# Patient Record
Sex: Female | Born: 1975 | Race: White | Hispanic: No | Marital: Married | Smoking: Former smoker
Health system: Southern US, Community
[De-identification: ages and names within clinical notes are randomized; demographics above are authoritative.]

## PROBLEM LIST (undated history)

## (undated) DIAGNOSIS — Z8632 Personal history of gestational diabetes: Secondary | ICD-10-CM

## (undated) DIAGNOSIS — F32A Depression, unspecified: Secondary | ICD-10-CM

## (undated) DIAGNOSIS — F329 Major depressive disorder, single episode, unspecified: Secondary | ICD-10-CM

## (undated) DIAGNOSIS — E282 Polycystic ovarian syndrome: Secondary | ICD-10-CM

## (undated) DIAGNOSIS — N926 Irregular menstruation, unspecified: Secondary | ICD-10-CM

## (undated) DIAGNOSIS — O24419 Gestational diabetes mellitus in pregnancy, unspecified control: Secondary | ICD-10-CM

## (undated) DIAGNOSIS — I10 Essential (primary) hypertension: Secondary | ICD-10-CM

## (undated) DIAGNOSIS — F419 Anxiety disorder, unspecified: Secondary | ICD-10-CM

## (undated) DIAGNOSIS — N979 Female infertility, unspecified: Secondary | ICD-10-CM

## (undated) HISTORY — DX: Major depressive disorder, single episode, unspecified: F32.9

## (undated) HISTORY — DX: Depression, unspecified: F32.A

## (undated) HISTORY — DX: Essential (primary) hypertension: I10

## (undated) HISTORY — DX: Personal history of gestational diabetes: Z86.32

## (undated) HISTORY — DX: Female infertility, unspecified: N97.9

## (undated) HISTORY — DX: Anxiety disorder, unspecified: F41.9

## (undated) HISTORY — DX: Gestational diabetes mellitus in pregnancy, unspecified control: O24.419

## (undated) HISTORY — DX: Polycystic ovarian syndrome: E28.2

## (undated) HISTORY — PX: TONSILLECTOMY: SUR1361

## (undated) HISTORY — DX: Irregular menstruation, unspecified: N92.6

## (undated) HISTORY — PX: ANTERIOR CRUCIATE LIGAMENT REPAIR: SHX115

---

## 1998-05-03 ENCOUNTER — Emergency Department (HOSPITAL_COMMUNITY): Admission: EM | Admit: 1998-05-03 | Discharge: 1998-05-03 | Payer: Self-pay | Admitting: Emergency Medicine

## 1999-06-28 HISTORY — PX: REDUCTION MAMMAPLASTY: SUR839

## 1999-07-16 ENCOUNTER — Encounter: Payer: Self-pay | Admitting: Specialist

## 1999-07-16 ENCOUNTER — Encounter: Admission: RE | Admit: 1999-07-16 | Discharge: 1999-07-16 | Payer: Self-pay | Admitting: Specialist

## 1999-07-19 ENCOUNTER — Ambulatory Visit (HOSPITAL_BASED_OUTPATIENT_CLINIC_OR_DEPARTMENT_OTHER): Admission: RE | Admit: 1999-07-19 | Discharge: 1999-07-20 | Payer: Self-pay | Admitting: Specialist

## 1999-07-19 ENCOUNTER — Encounter (INDEPENDENT_AMBULATORY_CARE_PROVIDER_SITE_OTHER): Payer: Self-pay | Admitting: Specialist

## 2000-07-05 ENCOUNTER — Other Ambulatory Visit: Admission: RE | Admit: 2000-07-05 | Discharge: 2000-07-05 | Payer: Self-pay | Admitting: Family Medicine

## 2003-08-14 ENCOUNTER — Other Ambulatory Visit: Admission: RE | Admit: 2003-08-14 | Discharge: 2003-08-14 | Payer: Self-pay | Admitting: Family Medicine

## 2004-10-04 ENCOUNTER — Other Ambulatory Visit: Admission: RE | Admit: 2004-10-04 | Discharge: 2004-10-04 | Payer: Self-pay | Admitting: Gynecology

## 2004-12-03 ENCOUNTER — Ambulatory Visit (HOSPITAL_COMMUNITY): Admission: RE | Admit: 2004-12-03 | Discharge: 2004-12-03 | Payer: Self-pay | Admitting: Gynecology

## 2005-06-07 ENCOUNTER — Other Ambulatory Visit: Admission: RE | Admit: 2005-06-07 | Discharge: 2005-06-07 | Payer: Self-pay | Admitting: Gynecology

## 2005-09-26 ENCOUNTER — Encounter: Admission: RE | Admit: 2005-09-26 | Discharge: 2005-09-26 | Payer: Self-pay | Admitting: Gynecology

## 2006-01-06 ENCOUNTER — Inpatient Hospital Stay (HOSPITAL_COMMUNITY): Admission: AD | Admit: 2006-01-06 | Discharge: 2006-01-09 | Payer: Self-pay | Admitting: Gynecology

## 2006-01-06 ENCOUNTER — Encounter (INDEPENDENT_AMBULATORY_CARE_PROVIDER_SITE_OTHER): Payer: Self-pay | Admitting: Specialist

## 2006-01-10 ENCOUNTER — Encounter: Admission: RE | Admit: 2006-01-10 | Discharge: 2006-01-13 | Payer: Self-pay | Admitting: Gynecology

## 2006-02-17 ENCOUNTER — Other Ambulatory Visit: Admission: RE | Admit: 2006-02-17 | Discharge: 2006-02-17 | Payer: Self-pay | Admitting: Gynecology

## 2006-12-07 ENCOUNTER — Ambulatory Visit (HOSPITAL_COMMUNITY): Admission: RE | Admit: 2006-12-07 | Discharge: 2006-12-08 | Payer: Self-pay | Admitting: Otolaryngology

## 2006-12-07 ENCOUNTER — Encounter (INDEPENDENT_AMBULATORY_CARE_PROVIDER_SITE_OTHER): Payer: Self-pay | Admitting: Otolaryngology

## 2008-03-06 ENCOUNTER — Ambulatory Visit: Payer: Self-pay | Admitting: Gynecology

## 2008-03-12 ENCOUNTER — Ambulatory Visit: Payer: Self-pay | Admitting: Gynecology

## 2008-03-24 ENCOUNTER — Ambulatory Visit: Payer: Self-pay | Admitting: Gynecology

## 2008-03-27 ENCOUNTER — Encounter: Payer: Self-pay | Admitting: Gynecology

## 2008-03-27 ENCOUNTER — Ambulatory Visit: Payer: Self-pay | Admitting: Gynecology

## 2008-03-27 ENCOUNTER — Ambulatory Visit (HOSPITAL_BASED_OUTPATIENT_CLINIC_OR_DEPARTMENT_OTHER): Admission: RE | Admit: 2008-03-27 | Discharge: 2008-03-27 | Payer: Self-pay | Admitting: Gynecology

## 2008-03-27 HISTORY — PX: HYSTEROSCOPY: SHX211

## 2008-04-27 IMAGING — CR DG CHEST 1V PORT
1 series · 1 of 1 positions shown · non-contrast
Comparison: None

CLINICAL DATA: Postop tonsillectomy. Hypoxemia.

CHEST - 1 VIEW

[view not recorded]
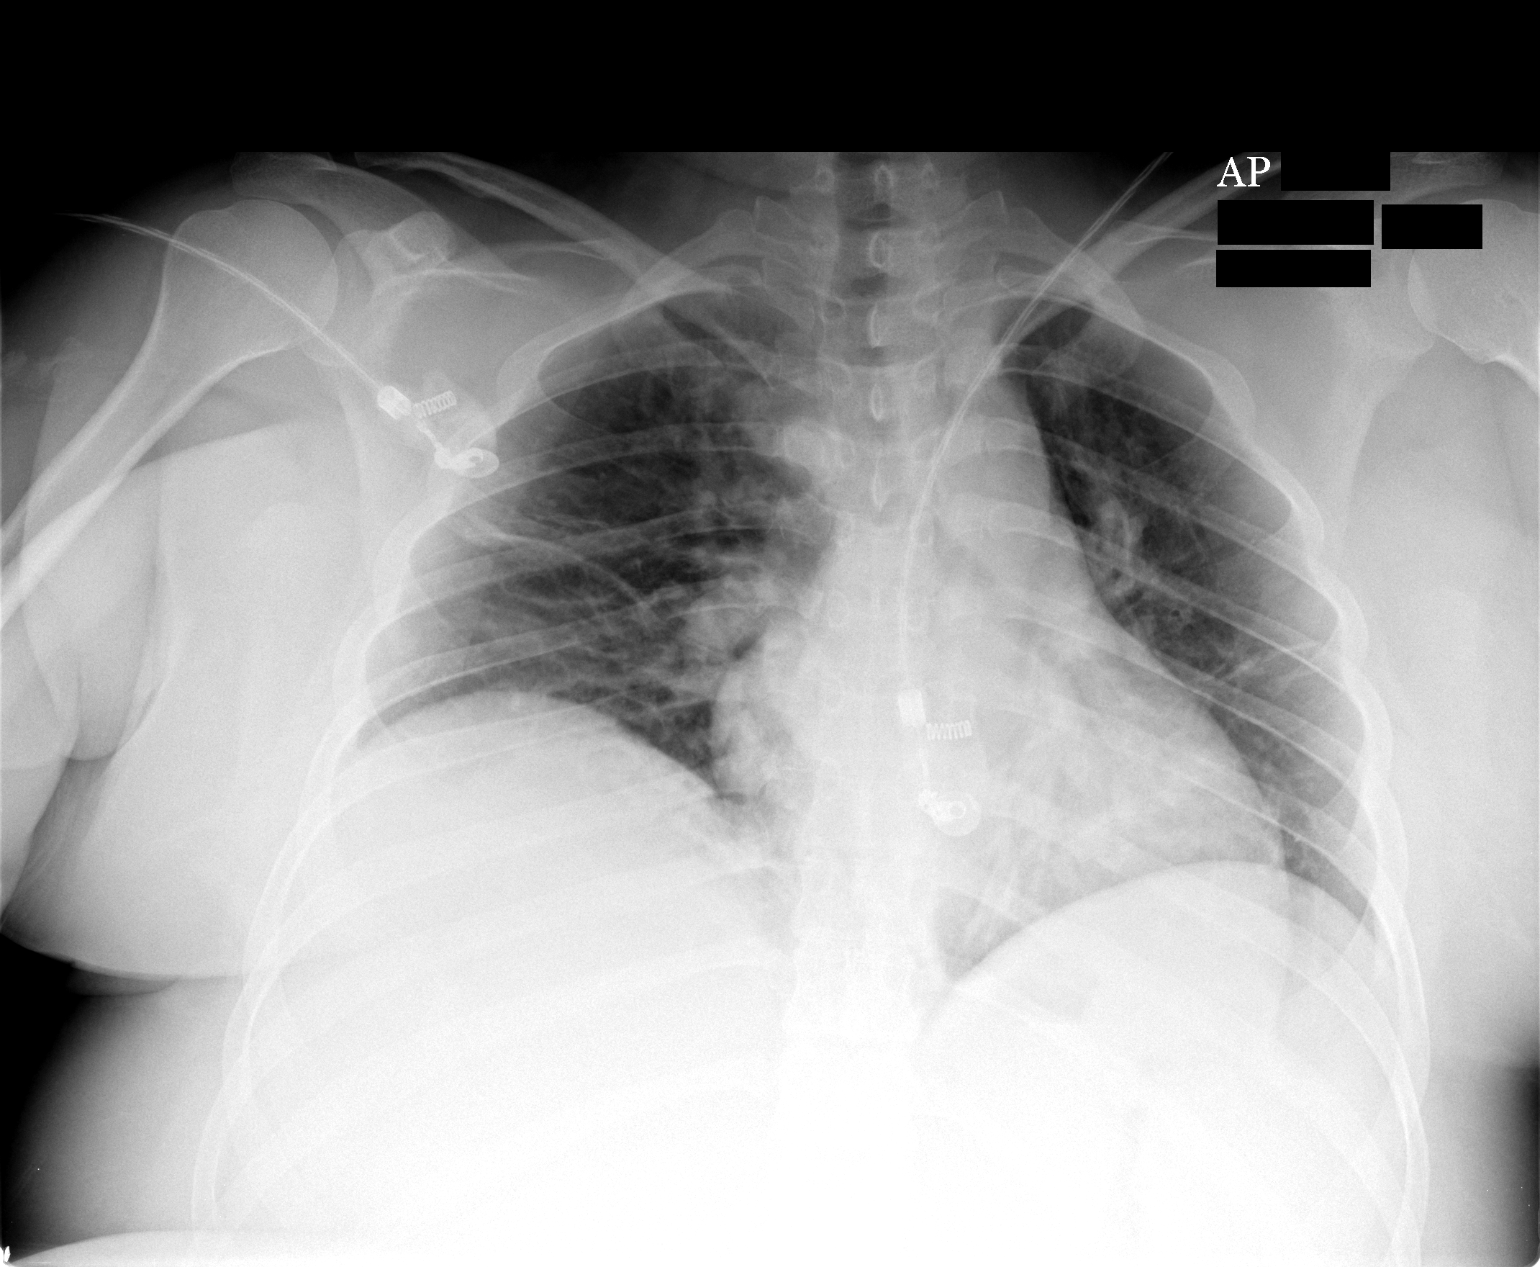

[1 of 1 positions shown; findings below may reference images not displayed]

FINDINGS: Midline trachea. Normal heart size for the level of inspiratory
effort. Normal mediastinal contours. Sharp costophrenic angles. No pneumothorax.

Markedly diminished lung volumes. Resultant prominence of pulmonary
interstitium. Linear bilateral perihilar opacities are likely related to
subsegmental atelectasis.

IMPRESSION

1. Low lung volumes with a bilateral perihilar subsegmental atelectasis.

## 2008-07-04 ENCOUNTER — Other Ambulatory Visit: Admission: RE | Admit: 2008-07-04 | Discharge: 2008-07-04 | Payer: Self-pay | Admitting: Gynecology

## 2008-07-04 ENCOUNTER — Ambulatory Visit: Payer: Self-pay | Admitting: Gynecology

## 2008-07-04 ENCOUNTER — Encounter: Payer: Self-pay | Admitting: Gynecology

## 2008-12-22 ENCOUNTER — Ambulatory Visit: Payer: Self-pay | Admitting: Gynecology

## 2009-01-16 ENCOUNTER — Ambulatory Visit: Payer: Self-pay | Admitting: Women's Health

## 2009-01-26 ENCOUNTER — Ambulatory Visit (HOSPITAL_BASED_OUTPATIENT_CLINIC_OR_DEPARTMENT_OTHER): Admission: RE | Admit: 2009-01-26 | Discharge: 2009-01-27 | Payer: Self-pay | Admitting: Orthopedic Surgery

## 2009-07-21 ENCOUNTER — Ambulatory Visit: Payer: Self-pay | Admitting: Gynecology

## 2009-08-06 ENCOUNTER — Ambulatory Visit: Payer: Self-pay | Admitting: Gynecology

## 2009-08-18 ENCOUNTER — Ambulatory Visit: Payer: Self-pay | Admitting: Gynecology

## 2009-08-24 ENCOUNTER — Ambulatory Visit: Payer: Self-pay | Admitting: Gynecology

## 2009-09-11 ENCOUNTER — Ambulatory Visit: Payer: Self-pay | Admitting: Gynecology

## 2009-09-15 ENCOUNTER — Ambulatory Visit: Payer: Self-pay | Admitting: Gynecology

## 2009-09-23 ENCOUNTER — Ambulatory Visit: Payer: Self-pay | Admitting: Gynecology

## 2009-10-02 ENCOUNTER — Ambulatory Visit: Payer: Self-pay | Admitting: Gynecology

## 2009-10-12 ENCOUNTER — Ambulatory Visit: Payer: Self-pay | Admitting: Gynecology

## 2009-10-19 ENCOUNTER — Ambulatory Visit: Payer: Self-pay | Admitting: Gynecology

## 2009-10-25 HISTORY — PX: DILATION AND CURETTAGE OF UTERUS: SHX78

## 2009-10-28 ENCOUNTER — Ambulatory Visit: Payer: Self-pay | Admitting: Gynecology

## 2009-10-29 ENCOUNTER — Ambulatory Visit (HOSPITAL_BASED_OUTPATIENT_CLINIC_OR_DEPARTMENT_OTHER): Admission: RE | Admit: 2009-10-29 | Discharge: 2009-10-29 | Payer: Self-pay | Admitting: Gynecology

## 2009-10-29 ENCOUNTER — Ambulatory Visit: Payer: Self-pay | Admitting: Gynecology

## 2009-11-12 ENCOUNTER — Ambulatory Visit: Payer: Self-pay | Admitting: Gynecology

## 2009-12-03 ENCOUNTER — Ambulatory Visit: Payer: Self-pay | Admitting: Gynecology

## 2010-03-03 ENCOUNTER — Ambulatory Visit: Payer: Self-pay | Admitting: Gynecology

## 2010-03-14 ENCOUNTER — Ambulatory Visit: Payer: Self-pay | Admitting: Gynecology

## 2010-03-23 ENCOUNTER — Ambulatory Visit: Payer: Self-pay | Admitting: Gynecology

## 2010-05-17 ENCOUNTER — Ambulatory Visit: Payer: Self-pay | Admitting: Gynecology

## 2010-05-26 ENCOUNTER — Ambulatory Visit: Payer: Self-pay | Admitting: Gynecology

## 2010-06-27 DIAGNOSIS — E282 Polycystic ovarian syndrome: Secondary | ICD-10-CM

## 2010-06-27 HISTORY — DX: Polycystic ovarian syndrome: E28.2

## 2010-08-31 ENCOUNTER — Other Ambulatory Visit (HOSPITAL_COMMUNITY): Payer: Self-pay | Admitting: Obstetrics and Gynecology

## 2010-08-31 DIAGNOSIS — N979 Female infertility, unspecified: Secondary | ICD-10-CM

## 2010-09-06 ENCOUNTER — Ambulatory Visit (HOSPITAL_COMMUNITY)
Admission: RE | Admit: 2010-09-06 | Discharge: 2010-09-06 | Disposition: A | Discharge status: Home / Self Care | Payer: Managed Care, Other (non HMO) | Source: Ambulatory Visit | Attending: Obstetrics and Gynecology | Admitting: Obstetrics and Gynecology

## 2010-09-06 DIAGNOSIS — N979 Female infertility, unspecified: Secondary | ICD-10-CM | POA: Insufficient documentation

## 2010-09-14 LAB — POCT HEMOGLOBIN-HEMACUE: Hemoglobin: 14.5 g/dL (ref 12.0–15.0)

## 2010-10-02 LAB — POCT HEMOGLOBIN-HEMACUE: Hemoglobin: 12.9 g/dL (ref 12.0–15.0)

## 2010-10-18 ENCOUNTER — Other Ambulatory Visit (HOSPITAL_COMMUNITY)
Admission: RE | Admit: 2010-10-18 | Discharge: 2010-10-18 | Disposition: A | Discharge status: Home / Self Care | Payer: Managed Care, Other (non HMO) | Source: Ambulatory Visit | Attending: Gynecology | Admitting: Gynecology

## 2010-10-18 ENCOUNTER — Other Ambulatory Visit: Payer: Self-pay | Admitting: Gynecology

## 2010-10-18 DIAGNOSIS — Z124 Encounter for screening for malignant neoplasm of cervix: Secondary | ICD-10-CM | POA: Insufficient documentation

## 2010-10-19 ENCOUNTER — Encounter (INDEPENDENT_AMBULATORY_CARE_PROVIDER_SITE_OTHER): Payer: Managed Care, Other (non HMO) | Admitting: Gynecology

## 2010-10-19 DIAGNOSIS — R809 Proteinuria, unspecified: Secondary | ICD-10-CM

## 2010-10-19 DIAGNOSIS — Z13220 Encounter for screening for lipoid disorders: Secondary | ICD-10-CM

## 2010-10-19 DIAGNOSIS — Z833 Family history of diabetes mellitus: Secondary | ICD-10-CM

## 2010-10-19 DIAGNOSIS — Z01419 Encounter for gynecological examination (general) (routine) without abnormal findings: Secondary | ICD-10-CM

## 2010-10-26 ENCOUNTER — Other Ambulatory Visit (INDEPENDENT_AMBULATORY_CARE_PROVIDER_SITE_OTHER): Payer: Managed Care, Other (non HMO)

## 2010-10-26 DIAGNOSIS — R7309 Other abnormal glucose: Secondary | ICD-10-CM

## 2010-11-03 ENCOUNTER — Other Ambulatory Visit: Payer: Managed Care, Other (non HMO)

## 2010-11-09 NOTE — Op Note (Signed)
NAME:  Melanie Barrett, Melanie Barrett      ACCOUNT NO.:  [id number]   MEDICAL RECORD NO.:  [id number]          PATIENT TYPE:  AMB   LOCATION:  DSC                          FACILITY:  [facility]   PHYSICIAN:  Robert A. Thurston Hole, M.D. DATE OF BIRTH:  [birth year]   DATE OF PROCEDURE:  01/26/2009  DATE OF DISCHARGE:                               OPERATIVE REPORT   PREOPERATIVE DIAGNOSES:  1. Right knee anterior cruciate ligament tear.  2. Right knee medial and lateral meniscal tears.  3. Right knee patellofemoral chondromalacia.   POSTOPERATIVE DIAGNOSES:  1. Right knee anterior cruciate ligament tear.  2. Right knee medial and lateral meniscal tears.  3. Right knee patellofemoral chondromalacia.   PROCEDURE:  1. Right knee examination under anesthesia followed by      arthroscopically assisted endoscopic Achilles tendon allograft,      anterior cruciate ligament reconstruction using 8 x 25-mm femoral      interference BioScrew and 9 x 28-mm tibial interference BioScrew      plus PushLock anchor.  2. Right knee partial medial and lateral meniscectomies.  3. Right knee patellofemoral chondroplasty.   SURGEON:  Elana Alm. Thurston Hole, MD   ASSISTANT:  Melanie Girt, PA   ANESTHESIA:  General.   OPERATIVE TIME:  1 hour 30 minutes.   COMPLICATIONS:  None.   INDICATIONS FOR PROCEDURE:  Melanie Barrett is a 32-year woman who  sustained a twisting pivot shift injury to her right knee approximately  2 months ago.  Exam and MRIs revealed a complete ACL tear and partial  meniscal tear with patellofemoral chondromalacia.  Melanie Barrett has had  persistent pain and instability and is now to undergo arthroscopy with  ACL reconstruction.   DESCRIPTION:  Melanie Barrett was brought to the operating room on  January 26, 2009, after a femoral nerve block was placed in the holding  room by Anesthesia.  Melanie Barrett was placed on the operative table in supine  position.  Her right knee was examined under anesthesia.   Melanie Barrett had full  range of motion, 3+ Lachman, positive pivot shift, knee stable to varus,  valgus, and posterior stress with normal patellar tracking.  The knee  was sterilely injected with 0.25% Marcaine with epinephrine.  Melanie Barrett  received Ancef 1 g IV preoperatively for prophylaxis.  The right leg was  then prepped using sterile DuraPrep and draped using sterile technique.  Originally through an anterolateral portal, the arthroscope with a pump  attached was placed and through an anteromedial portal, an arthroscopic  probe was placed.  On initial inspection, medial compartment and  articular cartilage were intact.  Medial meniscus showed partial tearing  10% of the posteromedial corner which was debrided back to a stable rim.  Intercondylar notch inspected.  The anterior cruciate ligament was  completely torn in its mid substance with significant anterior laxity  and this was thoroughly debrided and a notchplasty was performed.  Posterior cruciate was intact and stable.  Lateral compartment was  inspected.  The articular cartilage was normal.  Lateral meniscus showed  a tear of the posterolateral corner 20% which was resected back to a  stable rim.  Patellofemoral joint showed  75% grade 3 chondromalacia on  the patella only and this was debrided.  Femoral groove and articular  cartilage were intact.  Patella tracked normally.  Medial and lateral  gutters were free of pathology.  At this point, Melanie Barrett,  whose medical and surgical assistance was absolutely medically and  surgically necessary.  Prepared the ACL allograft on the back table  while I prepared the inside of the knee to accept this graft.  Melanie Barrett  prepared a 10 x 25-mm piece of Achilles bone with  two 12-mm graft.  While Melanie Barrett was doing this, I prepared the inside of the knee through a  1.5 cm anteromedial proximal tibial incision using a tibial drill guide.  The Steinmann pin was drilled up in the ACL insertion point on  tibial  plateau and then overdrilled with a 10-mm drill.  Through this tibial  tunnel, the posterior femoral guide was placed in the posterior femoral  notch.  Steinmann pin placed up into the posterior femoral notch and  drilled up into position of the ACL origin site and then overdrilled  with a 10-mm drill to a depth of 30 mm leaving a posterior 2-mm bone  ridge.  Double pin passer was then brought up through the tibial tunnel  and joined up to the femoral tunnel to the femoral cortex and thigh  through a stab wound.  This was used to pass the ACL graft up to the  tibial tunnel and jointed into the femoral tunnel.  It was locked into  position there with an 8 x 25-mm interference BioScrew.  The knee was  then brought through full range of motion.  There was found be no  impingement of the graft.  The tibial end of the graft was then locked  into position with a 9 x 28-mm interference BioScrew while Melanie  Barrett held the tibia reduced on the femur in 30 degrees of flexion.  The tibial end of the graft was further reinforced with 1 PushLock  anchor in the anteromedial tibia as well.  After this was done, knee was  tested for stability.  Lachman and pivot shift were found to be totally  eliminated and the knee could be brought through full range of motion  with no impingement of the graft.  At this point, it was felt that all  pathology had been satisfactorily addressed.  Instruments were removed.  The anteromedial incision closed with 2-0 Vicryl and 4-0 Prolene.  The  arthroscopic portals were closed with 3-0 nylon.  Sterile dressings and  a long-leg splint applied and then the patient was awakened, extubated,  and taken to recovery room in stable condition.  Needle and sponge  counts were correct x2 at the end of the case.   FOLLOWUP CARE:  Mr. Bahena will be followed overnight in  [facility] for IV pain control, neurovascular monitoring and  CPM use.   Discharge tomorrow on Percocet and Robaxin with a home CPM.  Melanie Barrett will be seen back in the office in a week for sutures out and follow  up.      Robert A. Thurston Hole, M.D.  Electronically Signed     RAW/MEDQ  D:  01/26/2009  T:  01/27/2009  Job:  [id number]

## 2010-11-09 NOTE — Op Note (Signed)
NAME:  Melanie Barrett, Melanie Barrett      ACCOUNT NO.:  [id number]   MEDICAL RECORD NO.:  [id number]          PATIENT TYPE:  AMB   LOCATION:  SDS                          FACILITY:  [facility]   PHYSICIAN:  Jefry H. Pollyann Kennedy, MD     DATE OF BIRTH:  [birth year]   DATE OF PROCEDURE:  12/07/2006  DATE OF DISCHARGE:                               OPERATIVE REPORT   PREOPERATIVE DIAGNOSES:  Obstructive sleep apnea syndrome and chronic  tonsillitis.   POSTOPERATIVE DIAGNOSES:  Obstructive sleep apnea syndrome and chronic  tonsillitis.   PROCEDURE:  Tonsillectomy.   SURGEON:  Jefry H. Pollyann Kennedy, MD.   ANESTHESIA:  General endotracheal anesthesia was used.   COMPLICATIONS:  No complications.   BLOOD LOSS:  50 mL.   FINDINGS:  Very large and deeply cryptic spaces with chronic debris  present bilaterally.  Several large-caliber vessels were feeding the  tonsils.   HISTORY:  A 35 year old with a history of chronic and recurrent  tonsillopharyngitis.  She also has a history of severe obstructive sleep  apnea syndrome, on CPAP.  Risks, benefits, alternatives and  complications of the procedure were explained to the patient and then  the family, who agreed with surgery.   DESCRIPTION OF PROCEDURE:  The patient was taken to the operating room  and placed on the operating table in supine position.  Following  induction of general endotracheal anesthesia, the table was turned and  the patient was draped in the standard fashion.  The Crowe-Davis mouth  gag was inserted into the oral cavity and used to retract the tongue and  mandible and attached to the Mayo stand.  Inspection of the palate  revealed no abnormalities.  A red rubber catheter was inserted into the  right side of the nose, was drawn into the mouth and used to retract the  soft palate and uvula through it.  Electrocautery dissection was used to  carefully dissect the tonsils from their capsules, attempting to avoid  vessels, but there were  several that were impossible to avoid.  These  were cauterized extensively with the suction cautery unit for completion  of hemostasis.  The tonsils were sent together for pathologic  evaluation.   At this time, blood and secretions were irrigated with saline solution,  and an orogastric tube was used to aspirate the contents of the stomach.  The patient was then awakened, extubated and transferred to recovery in  stable condition.      Jefry H. Pollyann Kennedy, MD  Electronically Signed     JHR/MEDQ  D:  12/07/2006  T:  12/07/2006  Job:  [id number]   cc:   Emeterio Reeve, MD

## 2010-11-09 NOTE — Op Note (Signed)
NAME:  Melanie Barrett, Melanie Barrett      ACCOUNT NO.:  [id number]   MEDICAL RECORD NO.:  [id number]          PATIENT TYPE:  AMB   LOCATION:  NESC                         FACILITY:  [facility]   PHYSICIAN:  Timothy P. Fontaine, M.D.DATE OF BIRTH:  Jan 10, 1976   DATE OF PROCEDURE:  03/27/2008  DATE OF DISCHARGE:                               OPERATIVE REPORT   PREOPERATIVE DIAGNOSES:  1. Irregular bleeding.  2. Endometrial polyps.   POSTOPERATIVE DIAGNOSES:  1. Irregular bleeding.  2. Endometrial polyps.   PROCEDURE:  Hysteroscopy, resection of endometrial polyps, dilatation  and curettage.   SURGEON:  Timothy P. Fontaine, M.D.   ANESTHESIA:  General with 1% lidocaine paracervical block.   ESTIMATED BLOOD LOSS:  Minimal.   SORBITOL DISCREPANCY:  Minimal.   SPECIMENS:  1. Endometrial polyps.  2. Endometrial curettings to pathology.   FINDINGS:  EUA, external BUS, vagina normal.  Cervix normal.  Uterus  normal size, midline and mobile.  Adnexa without masses.  Hysteroscopic  with several small posterior wall endometrial polyps.  Hysteroscopy  otherwise was normal and adequate, noting right and left tubal ostia.  Fundus, anterior, posterior uterine surfaces, lower uterine segment and  the cervical canal all visualized.   PROCEDURE:  Patient was taken to the operating room, underwent general  anesthesia.  Placed in low dorsal lithotomy position.  Received a  perineal-vaginal preparation with Betadine solution.  The bladder  emptied with in-and-out Foley catheterization.  EUA was performed, and  the patient was draped in the usual fashion.  The cervix was visualized  with a speculum.  The anterior lip grasped with a single-tooth  tenaculum, and a paracervical block using 1% lidocaine was placed, a  total of 10 cc.  The cervix was then gently and gradually dilated to  admit the operative hysteroscope, and hysteroscopy was performed with  findings noted above.  Using the right angle  resectoscopic loop, the  endometrial polyps were resected at their base at the level of the  surrounding endometrium.  A sharp curettage was then performed.  Both  specimens sent to pathology.  Re-hysteroscopy showed an empty  cavity, good distention, no evidence of perforation.  The instruments  were removed.  Hemostasis was visualized at the tenaculum site and the  cervical os.  The patient was placed in a supine position, awakened  without difficulty, and taken to the recovery room in good condition,  having tolerated the procedure well.      Timothy P. Fontaine, M.D.  Electronically Signed     TPF/MEDQ  D:  03/27/2008  T:  03/27/2008  Job:  [id number]

## 2010-11-09 NOTE — H&P (Signed)
NAME:  Melanie Barrett, Melanie Barrett      ACCOUNT NO.:  [id number]   MEDICAL RECORD NO.:  [id number]          PATIENT TYPE:  AMB   LOCATION:  NESC                         FACILITY:  [facility]   PHYSICIAN:  Timothy P. Fontaine, M.D.DATE OF BIRTH:  1976-03-07   DATE OF ADMISSION:  DATE OF DISCHARGE:                              HISTORY & PHYSICAL   DATE OF SURGERY:  October 1 at 1:00 p.m. at [facility].   CHIEF COMPLAINT:  Irregular bleeding, endometrial polyp.   HISTORY OF PRESENT ILLNESS:  Th[age]-year-old G1, P72 female presents with  a history of irregular bleeding with long skips up to 3-4 months without  period requiring progesterone withdrawals.  The patient notes over the  last several weeks she has bled on and off passing large clots and  presented for evaluation.  Outpatient evaluation included normal  hemoglobin of 14, normal prolactin and TSH, a sonohystogram, which  showed 2  clear endometrial polyps.  The patient is admitted at this  time for hysteroscopic resection and removal of endometrial polyps, D&C.   PAST MEDICAL HISTORY:  Includes:  1. Anxiety.  2. Depression.  3. Migraine headaches.   PAST SURGICAL HISTORY:  Includes breast reductive surgery.   ALLERGIES:  SULFA.   CURRENT MEDICATIONS:  1. Fluoxetine 40 mg.  2. Over-the-counter Claritin p.r.n.  3. Multivitamin.   REVIEW OF SYSTEMS:  Noncontributory.   FAMILY HISTORY:  Noncontributory.   SOCIAL HISTORY:  Noncontributory.   ADMISSION PHYSICAL EXAM:  Afebrile.  Vital signs are stable.  HEENT: Normal.  LUNGS:  Clear.  CARDIAC:  Regular rate.  No rubs, murmurs or gallops.  ABDOMINAL EXAM:  Benign.  PELVIC:  External BUS, vagina normal.  Cervix normal.  Uterus normal  size, midline and mobile, nontender.  Adnexa without masses or  tenderness.   ASSESSMENT:  Th[age]-year-old G1, P1 female with irregular menses.  Ultrasound demonstrating endometrial polyps, thyroid prolactin normal.  The patient is  admitted for hysteroscopy, D&C, polypectomy.  I reviewed  with the patient what is involved with the procedure to include  instrumentation, use of the resectoscope, D&C, use of the hysteroscope.  The risks of the procedure were reviewed to include infection, prolonged  antibiotics, uterine perforation, damage to internal organs including  bowel, bladder, ureters, vessels and nerves necessitating major  exploratory reparative surgeries, future reparative surgeries, bowel  resection, bladder repair, ureteral damage repair, ostomy formation was  all reviewed with her.  The risks of hemorrhage necessitating  transfusion and the risks of transfusion were also reviewed with her.  The risk of distended media absorption leading to metabolic  complications, seizures was also discussed, understood and accepted.  The patient understands no guarantees as far as bleeding relief were  given.  She does have bouts of amenorrhea which I think is more an  oligoovulatory pattern unrelated to the polyps, and I think that the  polyps are responsible for the most acute recent heavy bleeding.  She  understands we may need to deal with the irregular bleeding as time  moves forward following the D&C, polypectomy.  The patient's questions  answered to her satisfaction.  She is ready to  proceed with surgery.      Timothy P. Fontaine, M.D.  Electronically Signed     TPF/MEDQ  D:  03/24/2008  T:  03/24/2008  Job:  [id number]

## 2010-11-12 NOTE — Discharge Summary (Signed)
NAME:  Melanie Barrett, Melanie Barrett      ACCOUNT NO.:  [id number]   MEDICAL RECORD NO.:  [id number]          PATIENT TYPE:  INP   LOCATION:  9107                          FACILITY:  [facility]   PHYSICIAN:  Timothy P. Fontaine, M.D.DATE OF BIRTH:  January 06, 1976   DATE OF ADMISSION:  01/06/2006  DATE OF DISCHARGE:  01/09/2006                                 DISCHARGE SUMMARY   PRINCIPAL DIAGNOSIS:  Term pregnancy.  Additional diagnoses are diabetes and  suspected macrosomia.   PRINCIPAL PROCEDURE:  Primary cesarean section.   SURGEON:  Timothy P. Fontaine, M.D.   HOSPITAL COURSE:  The patient presented on January 06, 2006 for elective  primary cesarean section, due to a history of suspected macrosomia with an  unfavorable cervix; with delivery of viable female 8 pounds 8 ounces, Apgars  of 9 and 9.  Normal uterus, tubes and ovaries.  The patient  was transferred  to the recovery room and followed by the postpartum floor.  Her postpartum  course was unremarkable.  The patient was pumping bottle feeds;  was  requiring no analgesia.  She was ambulating without difficulty and voiding  without any problems at the time of discharge.  She remained afebrile and  vitals stable throughout.   POSTPARTUM EXAMINATION:  VITAL SIGNS:  She was afebrile.  HEART:  Regular rate.  LUNGS: Clear.  ABDOMEN:  Soft, nontender with good bowel sounds.  Her incision was intact.  GU:  The uterus  was firm below the umbilicus.  EXTREMITIES:  Negative.   DISCHARGE LABS:  Hemoglobin 10.4, hematocrit 28.8, white count 89, platelets  of 184.   disp  She was discharged to home.  Instructed to use Motrin necessary.   FOLLOW UP:  In the office in 6 weeks.      Ivor Costa. Farrel Gobble, M.D.  Electronically Signed     ______________________________  Nadyne Coombes. Fontaine, M.D.    THL/MEDQ  D:  01/09/2006  T:  01/09/2006  Job:  [id number]

## 2010-11-12 NOTE — Op Note (Signed)
NAME:  Melanie Barrett, Melanie Barrett      ACCOUNT NO.:  [id number]   MEDICAL RECORD NO.:  [id number]          PATIENT TYPE:  INP   LOCATION:  9199                          FACILITY:  [facility]   PHYSICIAN:  Timothy P. Fontaine, M.D.DATE OF BIRTH:  [birth year]   DATE OF PROCEDURE:  01/06/2006  DATE OF DISCHARGE:                                 OPERATIVE REPORT   PREOPERATIVE DIAGNOSES:  1.  Term pregnancy, suspect macrosomia.  2.  Gestational diabetes.  3.  Unfavorable cervix.   POSTOPERATIVE DIAGNOSES:  1.  Term pregnancy, suspect macrosomia.  2.  Gestational diabetes.  3.  Unfavorable cervix.   PROCEDURE:  Primary low transverse cervical cesarean section.   SURGEON:  Timothy P. Fontaine, M.D.   ASSISTANT:  Scrub technician.   ANESTHETIC:  Spinal.   ESTIMATED BLOOD LOSS:  Less than 500 mL.   COMPLICATIONS:  None.   FINDINGS:  At 72, normal female, Apgar scores 9/9, weight 8 pounds 8  ounces.  Pelvic anatomy noted to be normal.   PROCEDURE:  The patient was taken to the operating room, underwent spinal  anesthesia, was placed in the left tilt supine position, received an  abdominal preparation with Betadine solution, Foley catheter placed in  sterile technique per nursing personnel.  The patient was draped in the  usual fashion and after assuring adequate anesthesia, the abdomen was  sharply entered through a Pfannenstiel incision, achieving adequate  hemostasis at all levels.  The bladder flap was sharply and bluntly  developed without difficulty.  The uterus was sharply entered in the lower  uterine segment and bluntly extended laterally.  The membranes were  ruptured, the fluid noted to be clear.  The infant's vertex was in a  floating position and the vertex was delivered with the assistance of the  vacuum extractor.  The nares and mouth were suctioned, the rest of the  infant delivered, the cord doubly clamped and cut and the infant was handed  to Pediatrics in attendance.   Samples of cord blood were obtained.  Placenta  was then spontaneously extruded and was handed for cord blood banking  collection.  The uterus was then exteriorized, the endometrial cavity  explored with a sponge to remove all placental membrane fragments.  The  patient received antibiotic prophylaxis at this time.  The uterine incision  was closed in 2 layers using 0 Vicryl suture, first a running interlocking  stitch followed by an imbricating stitch.  The uterus was returned to the  abdomen, which was copiously irrigated, adequate hemostasis visualized, the  fascia reapproximated using 0 Vicryl suture in a running stitch.  The  subcutaneous tissues were irrigated, adequate hemostasis achieved with  electrocautery, the skin reapproximated using 4-0 Vicryl in a running  subcuticular stitch.  Steri-Strips and  Benzoin were applied, sterile  dressing applied, the patient taken the recovery room in good condition,  having tolerated the procedure well.      Timothy P. Fontaine, M.D.  Electronically Signed     TPF/MEDQ  D:  01/06/2006  T:  01/06/2006  Job:  [id number]

## 2010-11-12 NOTE — Discharge Summary (Signed)
[place]. [facility]  Patient:    Melanie Barrett                        MRN: [id number] Adm. Date:  [id number] Attending:  Gustavus Messing CC:         Yaakov Guthrie. Shon Hough, M.D. (2 copies)                           Discharge Summary  INDICATIONS:  A 35 year old lady with severe macromastia with back and shoulder  pain secondary to large, pendulous breasts.  PREOPERATIVE DIAGNOSIS:  Bilateral macromastia.  POSTOPERATIVE DIAGNOSIS:  Bilateral macromastia.  OPERATION:  Bilateral breast reductions using the inferior pedicle technique, excision of accessory breast tissue.  SURGEON:  Yaakov Guthrie. Shon Hough, M.D.  ASSISTANT:  Margaretha Sheffield  ANESTHESIA:  General  Preoperatively, the patient was set up and drawn for the inferior pedicle reduction mammoplasty, remarking the nipple areolar complexes 20 cm from the suprasternal  notch.   She then underwent general anesthesia and was intubated orally, prepped down to her chest breast areas in a routine fashion using Betadine scrub and solution, walled off with sterile towels, and draped so as to make a sterile field.  Xylocaine 0.50% with epinephrine 1:200,000 concentration was injected locally, 100 cc per side.  The wound was scored with a #15 blade and the skin of the inferior pedicle was de-epithelialized with a #20 blade.  Next, the medial and lateral fatty dermal pedicles were excised down to underlying fascia. Hemostasis was maintained with the Bovie electrocoagulation bilaterally.  More tissue was removed.  Next, a keyhole area was also debulked.  After proper hemostasis, the  flaps were then transposed and stayed with 3-0 Prolene.  Subcutaneous closure was done with 3-0 Monocryl x 2 layers, and then a running subcuticular stitch of 3-0 Monocryl and 5-0 Monocryl throughout the inverted T and midline area.  The wounds were drained with #10 Blake drains which were placed in the depth of the  wound nd brought out through the lateral-most portion of the incision and secured with 3-0 Prolene.  The wounds were cleansed.  Sterile dressings were applied to all the areas.  She withstood the procedures very well and was taken to the recovery room in good condition. DD:  07/19/99 TD:  07/19/99 Job: [id number]

## 2010-11-12 NOTE — H&P (Signed)
NAME:  Melanie Barrett, Melanie Barrett      ACCOUNT NO.:  [id number]   MEDICAL RECORD NO.:  [id number]          PATIENT TYPE:  INP   LOCATION:  NA                            FACILITY:  [facility]   PHYSICIAN:  Timothy P. Fontaine, M.D.DATE OF BIRTH:  1976/01/11   DATE OF ADMISSION:  01/06/2006  DATE OF DISCHARGE:                                HISTORY & PHYSICAL   CHIEF COMPLAINT:  1.  Pregnancy at term.  2.  Gestational diabetic.  3.  Impending fetal macrosomia.  4.  Unfavorable cervix.   HISTORY OF PRESENT ILLNESS:  A 35 year old G1 P0 female at [redacted] weeks  gestation, being followed for elevated fasting sugars, diet controlled with  good values.  She was found on a recent exam to have a high full ballotable  presentation, with ultrasound showing infant in the 95th percentile for  weight.  Options for management were reviewed to include continued expectant  management, serial induction, versus primary cesarean section.  Given the  extreme unfavorable pelvic exam, suspected clinical cephalopelvic  disproportion, gestational diabetes, large infant on ultrasound, the patient  and physician both felt prudent to proceed with a primary cesarean section.  For the remainder of her history, see her [place].   PHYSICAL EXAMINATION:  HEENT: Normal.  LUNGS: Clear.  CARDIAC: Regular rate.  No rubs, murmurs, or gallops.  ABDOMEN:  Gravid, vertex fetus, positive fetal heart tones.  PELVIC: 50% effaced, closed, high, ballotable presentation.   ASSESSMENT AND PLAN:  A 35 year old G1 P0, term pregnancy, gestational  diabetes, highly unfavorable exam. 95th percentile weight, for primary  cesarean section.  Reviewed options, to include continued expectant  management, the risks of the attempted vaginal delivery to include shoulder  dystocia, as well as highly probable failed induction if induction was  attempted, and the probable relative cephalopelvic disproportion with such  an unfavorable exam at term.   After a lengthy discussion, she wants to  proceed with primary cesarean section, the risk of which were discussed, to  include risks of infection, prolonged antibiotics, wound complications,  including opening and draining of incisions, closure by secondary intention,  the risks of hemorrhage necessitating transfusion, and the risks of  transfusion, including transfusion reaction, hepatitis, HIV, [place]  disease, and other unknown entities.  The risks of inadvertent injury to  internal organs, including bowel, bladder, ureters, vessels and nerves,  necessitating major exploratory reparative surgeries and future reparative  surgeries, including ostomy formation,  were all discussed, understood, and accepted.  The risks of fetal injury  during the birthing process, including musculoskeletal, neural, and scalpel  injuries, were all reviewed with her.  The patient's questions were answered  to her satisfaction.  She is ready to proceed with surgery.      Timothy P. Fontaine, M.D.  Electronically Signed     TPF/MEDQ  D:  01/05/2006  T:  01/05/2006  Job:  [id number]

## 2011-03-18 ENCOUNTER — Telehealth: Payer: Self-pay | Admitting: *Deleted

## 2011-03-18 NOTE — Telephone Encounter (Signed)
Pt called and would like you to fill out insurance form to help for discount toward her health savings account. Please advise.

## 2011-03-18 NOTE — Telephone Encounter (Signed)
Done

## 2011-03-18 NOTE — Telephone Encounter (Signed)
Pt called wanting Dr.Tf to fill out insurance form for her regarding pt blood pressure etc. Lm for pt to fax or drop form off for tf to fill out.

## 2011-04-14 LAB — CBC
HCT: 40.8
Platelets: 249
WBC: 6.6

## 2011-04-14 LAB — HCG, SERUM, QUALITATIVE: Preg, Serum: NEGATIVE

## 2011-04-14 LAB — BASIC METABOLIC PANEL
BUN: 13
Calcium: 10
GFR calc non Af Amer: >60
Potassium: 4.2

## 2011-10-14 ENCOUNTER — Encounter: Payer: Self-pay | Admitting: Gynecology

## 2011-10-14 DIAGNOSIS — E282 Polycystic ovarian syndrome: Secondary | ICD-10-CM | POA: Insufficient documentation

## 2011-10-14 DIAGNOSIS — F329 Major depressive disorder, single episode, unspecified: Secondary | ICD-10-CM | POA: Insufficient documentation

## 2011-10-14 DIAGNOSIS — F419 Anxiety disorder, unspecified: Secondary | ICD-10-CM | POA: Insufficient documentation

## 2011-10-14 DIAGNOSIS — N926 Irregular menstruation, unspecified: Secondary | ICD-10-CM | POA: Insufficient documentation

## 2011-10-14 DIAGNOSIS — G43909 Migraine, unspecified, not intractable, without status migrainosus: Secondary | ICD-10-CM | POA: Insufficient documentation

## 2011-10-24 ENCOUNTER — Ambulatory Visit (INDEPENDENT_AMBULATORY_CARE_PROVIDER_SITE_OTHER): Payer: Managed Care, Other (non HMO) | Admitting: Gynecology

## 2011-10-24 ENCOUNTER — Encounter: Payer: Self-pay | Admitting: Gynecology

## 2011-10-24 VITALS — BP 158/94 | Ht 65.5 in | Wt 216.0 lb

## 2011-10-24 DIAGNOSIS — N926 Irregular menstruation, unspecified: Secondary | ICD-10-CM

## 2011-10-24 DIAGNOSIS — Z01419 Encounter for gynecological examination (general) (routine) without abnormal findings: Secondary | ICD-10-CM

## 2011-10-24 MED ORDER — FLUOXETINE HCL 40 MG PO CAPS: 40.0000 mg | ORAL_CAPSULE | Freq: Every day | ORAL | Status: DC

## 2011-10-24 MED ORDER — METFORMIN HCL 1000 MG PO TABS: 1000.0000 mg | ORAL_TABLET | Freq: Two times a day (BID) | ORAL | Status: DC

## 2011-10-24 NOTE — Progress Notes (Signed)
Melanie Barrett [birth year] [id number]        36 y.o.  for annual exam.  Several issues noted below.  Past medical history,surgical history, medications, allergies, family history and social history were all reviewed and documented in the EPIC chart. ROS:  Was performed and pertinent positives and negatives are included in the history.  Exam: Sherrilyn Rist chaperone present Filed Vitals:   10/24/11 1010  BP: 158/94   General appearance  Normal Skin grossly normal Head/Neck normal with no cervical or supraclavicular adenopathy thyroid normal Lungs  clear Cardiac RR, without RMG Abdominal  soft, nontender, without masses, organomegaly or hernia Breasts  examined lying and sitting without masses, retractions, discharge or axillary adenopathy.  Bilateral reduction scars noted. Pelvic  Ext/BUS/vagina  normal   Cervix  normal    Uterus  Anteverted, normal size, shape and contour, midline and mobile nontender   Adnexa  Without masses or tenderness    Anus and perineum  normal   Rectovaginal  normal sphincter tone without palpated masses or tenderness.    Assessment/Plan:  35 y.o. female for annual exam.    1. Hypertension.  Patient's blood pressure is 150/94. She notes that the last several times that she has been seen in various offices her blood pressures have been elevated. Reviewed the need to be seen and evaluated and treated. Patient will make appointment to see her primary in follow up of this and knows the importance to do so. 2. Infertility. Patient had been on metformin per Dr. [name]. She stopped it last year transiently had more irregularity in her cycles and restarted it. She was supposed to take 2 g daily but is at 1 g daily having menses every 6 weeks or so. I refilled her metformin and she's going to increase to 1.5 g daily and then ultimately to 2 g per Dr. Lyndal Rainbow recommendation. She has not seen him since April of last year I strongly recommend she follow up with him  if she wants to pursue infertility. I did discuss with her the need to have her hypertension evaluated first and the risks of pregnancy with either well controlled or uncontrolled hypertension. I also reviewed the need to actively pursue fertility if this is what they desire as she is 85 and the issues of biological clock also reviewed.  She had approximately 8 cycles of Clomid without pregnancy between 2010 - 2011 and I do not feel we should continue this and she really needs to be seen by a reproductive endocrinologist. 3. Breast health. SBE monthly reviewed. Screening mammograms between 35 and 40 were reviewed. 4. Pap smear. Last Pap smear 2012. She has no history of abnormal Pap smears before with a number of normal records in her chart. No Pap smear was done today and we'll plan every 3 your Pap smear. 5. Health maintenance. No blood work was done today as she is not fasting and she is going to see her primary in follow up of her hypertension and I asked her to have him check her blood for diabetes follow up and other blood work as they see fit.    Dara Lords MD, 10:38 AM 10/24/2011

## 2011-10-24 NOTE — Patient Instructions (Signed)
Is very important that you follow up with your primary physician for your blood pressure and glucose monitoring.  Follow up with Dr. [name] in reference to infertility.

## 2011-10-26 ENCOUNTER — Other Ambulatory Visit: Payer: Self-pay | Admitting: *Deleted

## 2011-10-26 MED ORDER — METFORMIN HCL 1000 MG PO TABS: 1000.0000 mg | ORAL_TABLET | Freq: Two times a day (BID) | ORAL | Status: DC

## 2011-10-26 MED ORDER — FLUOXETINE HCL 40 MG PO CAPS: 40.0000 mg | ORAL_CAPSULE | Freq: Every day | ORAL | Status: DC

## 2011-10-26 NOTE — Progress Notes (Signed)
Patient needed rx sent to another pharmacy

## 2012-01-23 ENCOUNTER — Encounter: Payer: Self-pay | Admitting: Gynecology

## 2012-01-23 ENCOUNTER — Other Ambulatory Visit: Payer: Self-pay | Admitting: Gynecology

## 2012-01-23 ENCOUNTER — Ambulatory Visit (INDEPENDENT_AMBULATORY_CARE_PROVIDER_SITE_OTHER): Payer: Managed Care, Other (non HMO) | Admitting: Gynecology

## 2012-01-23 DIAGNOSIS — R300 Dysuria: Secondary | ICD-10-CM

## 2012-01-23 LAB — URINALYSIS W MICROSCOPIC + REFLEX CULTURE
Bilirubin Urine: NEGATIVE
Crystals: NONE SEEN
Nitrite: POSITIVE — AB
Specific Gravity, Urine: 1.025 (ref 1.005–1.030)
Urobilinogen, UA: 0.2 mg/dL (ref 0.0–1.0)

## 2012-01-23 LAB — WET PREP FOR TRICH, YEAST, CLUE
Trich, Wet Prep: NONE SEEN
WBC, Wet Prep HPF POC: NONE SEEN
Yeast Wet Prep HPF POC: NONE SEEN

## 2012-01-23 MED ORDER — NITROFURANTOIN MONOHYD MACRO 100 MG PO CAPS: 100.0000 mg | ORAL_CAPSULE | Freq: Two times a day (BID) | ORAL | Status: AC

## 2012-01-23 NOTE — Patient Instructions (Signed)
Take antibiotic twice daily for 7 days. Follow up if symptoms persist or recur.

## 2012-01-23 NOTE — Progress Notes (Signed)
Patient presents with a several days of increasing dysuria. Notes a little bit of vaginal discharge but thinks it is ovulatory as she is midcycle.  No fever chills nausea vomiting or other constitutional symptoms.  Exam with kim assistant Spine straight no CVA tenderness Abdomen soft nontender without masses guarding rebound organomegaly Pelvic external BUS vagina normal. Cervix normal. Uterus normal size midline mobile nontender. Adnexa without masses or tenderness.  Assessment and plan: Urinalysis and symptoms consistent with UTI. We'll treat with Macrobid 100 mg twice a day x7 days follow up if symptoms persist or recur. Wet prep negative and I agree that her discharge is ovulatory.

## 2012-01-26 LAB — URINE CULTURE

## 2012-10-26 ENCOUNTER — Encounter: Payer: Self-pay | Admitting: Gynecology

## 2012-10-26 ENCOUNTER — Ambulatory Visit (INDEPENDENT_AMBULATORY_CARE_PROVIDER_SITE_OTHER): Payer: Managed Care, Other (non HMO) | Admitting: Gynecology

## 2012-10-26 VITALS — BP 140/94 | Ht 65.0 in | Wt 196.0 lb

## 2012-10-26 DIAGNOSIS — N926 Irregular menstruation, unspecified: Secondary | ICD-10-CM

## 2012-10-26 DIAGNOSIS — I10 Essential (primary) hypertension: Secondary | ICD-10-CM

## 2012-10-26 DIAGNOSIS — Z01419 Encounter for gynecological examination (general) (routine) without abnormal findings: Secondary | ICD-10-CM

## 2012-10-26 DIAGNOSIS — IMO0002 Reserved for concepts with insufficient information to code with codable children: Secondary | ICD-10-CM

## 2012-10-26 NOTE — Patient Instructions (Signed)
Follow up for ultrasound as scheduled 

## 2012-10-26 NOTE — Progress Notes (Signed)
Melanie Barrett April 23, 1976 [id number]        37 y.o.  G2P1011 for annual exam.  Several issues noted below.  Past medical history,surgical history, medications, allergies, family history and social history were all reviewed and documented in the EPIC chart. ROS:  Was performed and pertinent positives and negatives are included in the history.  Exam: [name] Filed Vitals:   10/26/12 0854  BP: 140/94  Height: 5\' 5"  (1.651 m)  Weight: 196 lb (88.905 kg)   General appearance  Normal Skin grossly normal Head/Neck normal with no cervical or supraclavicular adenopathy thyroid normal Lungs  clear Cardiac RR, without RMG Abdominal  soft, nontender, without masses, organomegaly or hernia Breasts  examined lying and sitting without masses, retractions, discharge or axillary adenopathy. Pelvic  Ext/BUS/vagina  normal   Cervix  normal   Uterus  anteverted, normal size, shape and contour, midline and mobile nontender   Adnexa  Without masses or tenderness    Anus and perineum  normal   Rectovaginal  normal sphincter tone without palpated masses or tenderness.    Assessment/Plan:  37 y.o. G86P1011 female for annual exam.   1. PCO/infertility. Patient's menses continue at 6-8 weeks. Long history of PCO. Still attempting pregnancy. Had used Clomid in the past both for successful pregnancies and then unsuccessful trial. Saw Dr. [name] and was started on metformin. She has not seen him though for over a year. Still wants to achieve pregnancy. She just joined [employer] and I told her that if she loses a fair amount of weight this certainly will significantly improve her pregnancy chances as well as I would consider using Clomid again if we altered her weight. I discussed the issues of Clomid to include hyperstimulation multiple gestation and possible ovarian cancer linkage particularly in patients who use prolonged courses. She is on a prenatal vitamin and will continue on  this. 2. Dyspareunia/deep. Particularly over the past month patient is having consistent deep dyspareunia. She also has significant ovulatory pain. I discussed possible endometriosis and recommended to start with ultrasound and she'll schedule this in followup for this. 3. Hypertension. Her blood pressure is 140/94. She is on Aldomet 250 mg supposedly twice daily but admits to only taking it once daily. I reviewed the need to be more aggressive in her hypertensive management and she actually has an appointment with her primary is to follow up with them for this. 4. Mammography. I reviewed screening mammogram recommendations between 35 and 40. She has no strong family history and she is comfortable with waiting closer to 40. SBE monthly reviewed. 5. Pap smear 2012. No Pap smear done today. No history of significant abnormal Pap smears. Plan repeat Pap smear next year a 3 year interval. 6. Health maintenance. No blood work done today as it is all done through her primary physician's office to include following her glucose and lipid profile. Patient will followup for ultrasound and we'll go from there.    Dara Lords MD, 9:27 AM 10/26/2012

## 2012-10-27 LAB — URINALYSIS W MICROSCOPIC + REFLEX CULTURE
Bacteria, UA: NONE SEEN
Hgb urine dipstick: NEGATIVE
Ketones, ur: NEGATIVE mg/dL
Leukocytes, UA: NEGATIVE
Nitrite: NEGATIVE
Protein, ur: 30 mg/dL — AB

## 2012-11-02 ENCOUNTER — Other Ambulatory Visit: Payer: Self-pay | Admitting: Gynecology

## 2012-11-09 ENCOUNTER — Encounter: Payer: Self-pay | Admitting: Gynecology

## 2012-11-09 ENCOUNTER — Ambulatory Visit (INDEPENDENT_AMBULATORY_CARE_PROVIDER_SITE_OTHER): Payer: Managed Care, Other (non HMO) | Admitting: Gynecology

## 2012-11-09 ENCOUNTER — Ambulatory Visit (INDEPENDENT_AMBULATORY_CARE_PROVIDER_SITE_OTHER): Payer: Managed Care, Other (non HMO)

## 2012-11-09 DIAGNOSIS — E282 Polycystic ovarian syndrome: Secondary | ICD-10-CM

## 2012-11-09 DIAGNOSIS — IMO0002 Reserved for concepts with insufficient information to code with codable children: Secondary | ICD-10-CM

## 2012-11-09 NOTE — Progress Notes (Signed)
Patient presents for ultrasound due 2 dyspareunia. Ultrasound shows uterus normal size and echotexture. 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 Endometrial echo 4.9 mm. Right and left ovaries normal with classic ring of pearls sign on the right ovary. Multiple small follicles on the left also. Cul-de-sac negative.  Assessment and plan: Deep dyspareunia. Reviewed possibilities to include endometriosis or adhesive disease. Next appendectomy laparoscopy. Patient to monitor her pain if it does persist worsen then she'll followup to consider laparoscopy. She is in [employer] and has lost 9 pounds. She can reproduce and when she is at 180 pounds which is the weight she was when she conceived her first child and we will consider Clomid at that point. If her pain continues I discussed possible laparoscopy before Clomid and that if we do find endometriosis and treated that this may improve her fertility chances.

## 2012-11-09 NOTE — Patient Instructions (Signed)
Consider laparoscopy if your pain continues. Followup as needed.

## 2013-02-11 ENCOUNTER — Telehealth: Payer: Self-pay | Admitting: *Deleted

## 2013-02-11 NOTE — Telephone Encounter (Signed)
Pt said she will continue taking medication, she was unaware this was helping with her fertility.

## 2013-02-11 NOTE — Telephone Encounter (Signed)
Certainly if she does not feel good on the medication she can always stop it. She would need to follow her menses and as long as they remained regular then follow. She would accept any possible negative impact as far as fertility by stopping the medication.

## 2013-02-11 NOTE — Telephone Encounter (Signed)
Pt diagnosed with PCOS. had referral to see Dr.YakinKaya he prescribed metformin 1,000 twice daily but pt has been taking only 1,000 mg daily has not seen him in over a year. Pt said she cycles continue every 4-5 weeks apart and this is normal for her. She is losing weight, pt asked if you think she could stop metformin? It doesn't make her feel good, She is not going back to see Dr,.Dyke Brackett and would like your recommendations. Please advise

## 2013-02-13 ENCOUNTER — Other Ambulatory Visit: Payer: Self-pay | Admitting: Gynecology

## 2013-05-07 ENCOUNTER — Encounter: Payer: Self-pay | Admitting: Gynecology

## 2013-05-07 ENCOUNTER — Ambulatory Visit (INDEPENDENT_AMBULATORY_CARE_PROVIDER_SITE_OTHER): Payer: 59 | Admitting: Gynecology

## 2013-05-07 DIAGNOSIS — E282 Polycystic ovarian syndrome: Secondary | ICD-10-CM

## 2013-05-07 DIAGNOSIS — N926 Irregular menstruation, unspecified: Secondary | ICD-10-CM

## 2013-05-07 MED ORDER — CLOMIPHENE CITRATE 50 MG PO TABS: 50.0000 mg | ORAL_TABLET | Freq: Every day | ORAL | Status: DC

## 2013-05-07 NOTE — Patient Instructions (Signed)
Take Clomid 50 mg daily days 5 through 9 of your next cycle. Call me at the end of the cycle with either your menses or if late to check a pregnancy test

## 2013-05-07 NOTE — Progress Notes (Signed)
Patient presents to discuss her irregular menses and desires for pregnancy. She is a history of PCOS with prior pregnancies achieved using Clomid x2 one live birth and one early SAB. Subsequently saw Dr. [name] who put her on Glucophage and she's on 1000 mg twice a day. She has lost a fair amount of weight and now her menses have become more regular for her last cycle was 4 weeks although prior to that it was 6 weeks. She desires a trial of Clomid. She was on Femara which I utilized by Dr. [name] but unfortunately did not achieve pregnancy with the next step being in vitro. Since she achieved 2 pregnancies prior with Clomid she wanted to try this first before moving on to in vitro. I reviewed with her the possible benefits of Femara over Clomid in a PCOS patient as far as stimulation and endometrial effect. I reviewed the risks of Clomid to include hyperstimulation, multiple gestation including triplets and possible ovarian cancer linkage. She is over the age of 7 and the increased risk for chromosomal anomalies also reviewed. She's been treated with low-dose Aldomet for hypertension and the increased risk of hypertensive disorders in pregnancy associated with patient's with chronic hypertension also discussed. She is on Prozac which I recommend she stop now or as soon as possible she recognizes pregnancy and she is on a multivitamin with folic acid.  Exam was Administrator, [employer] vagina normal. Cervix normal. Uterus normal size midline mobile nontender. Adnexa without masses or tenderness.  Assessment and plan: PCOS patient with weight loss on metformin with more regular menses. Desires trial of Clomid given prior success recognizing the issues when compared to Femara and the risks involved as outlined above. We'll go with 50 mg Clomid days 5 through 9 #5 no refill and she will call at the end of the first cycle to see how she's doing and we'll go from there.

## 2013-07-05 ENCOUNTER — Telehealth: Payer: Self-pay | Admitting: *Deleted

## 2013-07-05 DIAGNOSIS — N912 Amenorrhea, unspecified: Secondary | ICD-10-CM

## 2013-07-05 NOTE — Telephone Encounter (Signed)
Pt called [facility] 05/25/13 did Clomid cycle. 2 positive home preg test. Requested Quant. Wants to come Monday morning. Quant order placed. Will call with results and will make apt after results come back

## 2013-07-08 ENCOUNTER — Other Ambulatory Visit: Payer: 59

## 2013-07-08 ENCOUNTER — Telehealth: Payer: Self-pay | Admitting: Gynecology

## 2013-07-08 DIAGNOSIS — N912 Amenorrhea, unspecified: Secondary | ICD-10-CM

## 2013-07-08 LAB — HCG, QUANTITATIVE, PREGNANCY: hCG, Beta Chain, Quant, S: 4073.2 m[IU]/mL

## 2013-07-08 NOTE — Telephone Encounter (Signed)
Tell patient that her quantitative hCG is 4000 which is definitely up there. Schedule ultrasound for viability and fetal number.

## 2013-07-09 NOTE — Telephone Encounter (Signed)
Pt informed with the below note, transferred to front desk. Order placed.

## 2013-07-09 NOTE — Telephone Encounter (Signed)
Left message on pt voicemail

## 2013-07-10 ENCOUNTER — Ambulatory Visit (INDEPENDENT_AMBULATORY_CARE_PROVIDER_SITE_OTHER): Payer: 59

## 2013-07-10 ENCOUNTER — Other Ambulatory Visit: Payer: Self-pay | Admitting: Gynecology

## 2013-07-10 ENCOUNTER — Ambulatory Visit (INDEPENDENT_AMBULATORY_CARE_PROVIDER_SITE_OTHER): Payer: 59 | Admitting: Gynecology

## 2013-07-10 ENCOUNTER — Encounter: Payer: Self-pay | Admitting: Gynecology

## 2013-07-10 DIAGNOSIS — O200 Threatened abortion: Secondary | ICD-10-CM

## 2013-07-10 DIAGNOSIS — O30009 Twin pregnancy, unspecified number of placenta and unspecified number of amniotic sacs, unspecified trimester: Secondary | ICD-10-CM

## 2013-07-10 DIAGNOSIS — O30049 Twin pregnancy, dichorionic/diamniotic, unspecified trimester: Secondary | ICD-10-CM

## 2013-07-10 DIAGNOSIS — N831 Corpus luteum cyst of ovary, unspecified side: Secondary | ICD-10-CM

## 2013-07-10 DIAGNOSIS — N912 Amenorrhea, unspecified: Secondary | ICD-10-CM

## 2013-07-10 DIAGNOSIS — O3091 Multiple gestation, unspecified, first trimester: Secondary | ICD-10-CM

## 2013-07-10 DIAGNOSIS — O09529 Supervision of elderly multigravida, unspecified trimester: Secondary | ICD-10-CM

## 2013-07-10 LAB — US OB TRANSVAGINAL

## 2013-07-10 LAB — US OB COMP LESS 14 WKS

## 2013-07-10 NOTE — Patient Instructions (Signed)
Wean from Prozac as we discussed. Call to make an appointment this week for a new OB appointment. Alert them that you have twins.

## 2013-07-10 NOTE — Progress Notes (Signed)
Patient presents for ultrasound. Did Clomid cycle with missed menses and positive pregnancy test. Quantitative hCG returned 4000.  Ultrasound shows twin gestation: Twin A  5 weeks 5 days FHM 114 bpm. Subchorionic hematoma between sac A and B.. Cervix long and closed Twin B 5 weeks 1 day FHM 97 bpm no yolk sac noted.  Assessment and plan: Twin gestation following Clomid cycle. Reviewed possible loss of twin B with the parents. She's doing no bleeding having mild cramping. Copies of her ultrasound report provided. Patient will call this week make an appointment to see an obstetrician and alert them that she has twins. Risks reviewed to include preterm delivery. Need to wean from Prozac reviewed and she's going to do this over the next month. We have discussed previously the risks of chromosomal abnormalities as well as the hypertension issue per my 11/11 2014 note. Patient will followup with an obstetrician for continuing care. She is on prenatal vitamins and will continue on so.

## 2013-08-08 LAB — OB RESULTS CONSOLE RUBELLA ANTIBODY, IGM: Rubella: IMMUNE

## 2013-08-08 LAB — OB RESULTS CONSOLE RPR: RPR: NONREACTIVE

## 2013-08-08 LAB — OB RESULTS CONSOLE HEPATITIS B SURFACE ANTIGEN: Hepatitis B Surface Ag: NEGATIVE

## 2013-08-08 LAB — OB RESULTS CONSOLE ABO/RH: RH TYPE: POSITIVE

## 2013-08-08 LAB — OB RESULTS CONSOLE HIV ANTIBODY (ROUTINE TESTING): HIV: NONREACTIVE

## 2013-08-08 LAB — OB RESULTS CONSOLE ANTIBODY SCREEN: Antibody Screen: NEGATIVE

## 2013-12-18 ENCOUNTER — Encounter: Payer: 59 | Attending: Obstetrics and Gynecology

## 2013-12-18 VITALS — Ht 65.0 in | Wt 224.2 lb

## 2013-12-18 DIAGNOSIS — O99810 Abnormal glucose complicating pregnancy: Secondary | ICD-10-CM | POA: Diagnosis present

## 2013-12-18 DIAGNOSIS — Z713 Dietary counseling and surveillance: Secondary | ICD-10-CM | POA: Diagnosis not present

## 2013-12-23 NOTE — Progress Notes (Signed)
  Patient was seen on 12/18/13 for Gestational Diabetes self-management class at the [facility]. The following learning objectives were met by the patient during this course:   States the definition of Gestational Diabetes  States why dietary management is important in controlling blood glucose  Describes the effects of carbohydrates on blood glucose levels  Demonstrates ability to create a balanced meal plan  Demonstrates carbohydrate counting   States when to check blood glucose levels  Demonstrates proper blood glucose monitoring techniques  States the effect of stress and exercise on blood glucose levels  States the importance of limiting caffeine and abstaining from alcohol and smoking  Plan:  Aim for 2 Carb Choices per meal (30 grams) +/- 1 either way for breakfast Aim for 3 Carb Choices per meal (45 grams) +/- 1 either way from lunch and dinner Aim for 1-2 Carbs per snack Begin reading food labels for Total Carbohydrate and sugar grams of foods Consider  increasing your activity level by walking daily as tolerated Begin checking BG before breakfast and 1-2 hours after first bit of breakfast, lunch and dinner after  as directed by MD  Take medication  as directed by MD  Blood glucose monitor given:  One Touch Ultra 2 Self Monitoring Kit Lot # [id number] X Exp: 3/16 Blood glucose reading: $RemoveBeforeDE'97mg'AuYEivkmfdRwEVF$ /dl  Patient instructed to monitor glucose levels: FBS: 60 - <90 1 hour: <140 2 hour: <120  Patient received the following handouts:  Nutrition Diabetes and Pregnancy  Carbohydrate Counting List  Meal Planning worksheet  Patient will be seen for follow-up as needed.

## 2014-01-27 ENCOUNTER — Other Ambulatory Visit (HOSPITAL_COMMUNITY): Payer: Self-pay | Admitting: Obstetrics and Gynecology

## 2014-01-27 ENCOUNTER — Ambulatory Visit (HOSPITAL_COMMUNITY)
Admission: RE | Admit: 2014-01-27 | Discharge: 2014-01-27 | Disposition: A | Discharge status: Home / Self Care | Payer: 59 | Source: Ambulatory Visit | Attending: Obstetrics and Gynecology | Admitting: Obstetrics and Gynecology

## 2014-01-27 DIAGNOSIS — O30009 Twin pregnancy, unspecified number of placenta and unspecified number of amniotic sacs, unspecified trimester: Secondary | ICD-10-CM | POA: Diagnosis not present

## 2014-01-27 DIAGNOSIS — O288 Other abnormal findings on antenatal screening of mother: Secondary | ICD-10-CM

## 2014-01-27 DIAGNOSIS — O289 Unspecified abnormal findings on antenatal screening of mother: Secondary | ICD-10-CM | POA: Insufficient documentation

## 2014-02-18 ENCOUNTER — Inpatient Hospital Stay (HOSPITAL_COMMUNITY)
Admission: AD | Admit: 2014-02-18 | Payer: Managed Care, Other (non HMO) | Source: Ambulatory Visit | Admitting: Obstetrics and Gynecology

## 2014-02-19 ENCOUNTER — Encounter (HOSPITAL_COMMUNITY): Payer: Self-pay

## 2014-02-19 ENCOUNTER — Encounter (HOSPITAL_COMMUNITY)
Admission: RE | Admit: 2014-02-19 | Discharge: 2014-02-19 | Disposition: A | Discharge status: Home / Self Care | Payer: 59 | Source: Ambulatory Visit | Attending: Obstetrics and Gynecology | Admitting: Obstetrics and Gynecology

## 2014-02-19 LAB — COMPREHENSIVE METABOLIC PANEL WITH GFR
ALT: 6 U/L (ref 0–35)
AST: 11 U/L (ref 0–37)
Albumin: 2.7 g/dL — ABNORMAL LOW (ref 3.5–5.2)
Alkaline Phosphatase: 126 U/L — ABNORMAL HIGH (ref 39–117)
Anion gap: 11 (ref 5–15)
BUN: 7 mg/dL (ref 6–23)
CO2: 25 meq/L (ref 19–32)
Calcium: 8.7 mg/dL (ref 8.4–10.5)
Chloride: 103 meq/L (ref 96–112)
Creatinine, Ser: 0.57 mg/dL (ref 0.50–1.10)
GFR calc Af Amer: >90 mL/min
GFR calc non Af Amer: >90 mL/min
Glucose, Bld: 82 mg/dL (ref 70–99)
Potassium: 3.6 meq/L — ABNORMAL LOW (ref 3.7–5.3)
Sodium: 139 meq/L (ref 137–147)
Total Bilirubin: 0.7 mg/dL (ref 0.3–1.2)
Total Protein: 5.7 g/dL — ABNORMAL LOW (ref 6.0–8.3)

## 2014-02-19 LAB — CBC
HCT: 32.3 % — ABNORMAL LOW (ref 36.0–46.0)
Hemoglobin: 11.1 g/dL — ABNORMAL LOW (ref 12.0–15.0)
MCH: 30.7 pg (ref 26.0–34.0)
MCHC: 34.4 g/dL (ref 30.0–36.0)
MCV: 89.5 fL (ref 78.0–100.0)
Platelets: 136 10*3/uL — ABNORMAL LOW (ref 150–400)
RBC: 3.61 MIL/uL — ABNORMAL LOW (ref 3.87–5.11)
RDW: 17.2 % — ABNORMAL HIGH (ref 11.5–15.5)
WBC: 6.7 10*3/uL (ref 4.0–10.5)

## 2014-02-19 LAB — ABO/RH: ABO/RH(D): B POS

## 2014-02-19 LAB — SYPHILIS: RPR W/REFLEX TO RPR TITER AND TREPONEMAL ANTIBODIES, TRADITIONAL SCREENING AND DIAGNOSIS ALGORITHM

## 2014-02-19 NOTE — Pre-Procedure Instructions (Signed)
Platelet count of 136 reported to Dr Rodman Pickle, order given to repeat day of surgery.

## 2014-02-19 NOTE — Patient Instructions (Signed)
20 Flannery Cavallero  02/19/2014   Your procedure is scheduled on:  02/21/14  Enter through the Main Entrance of [facility] at 6 AM.  Pick up the phone at the desk and dial [phone].   Call this number if you have problems the morning of surgery: [phone]   Remember:   Do not eat food:After Midnight.  Do not drink clear liquids: After Midnight.  Take these medicines the morning of surgery with A SIP OF WATER: Blood pressure medication   Do not wear jewelry, make-up or nail polish.  Do not wear lotions, powders, or perfumes. You may wear deodorant.  Do not shave 48 hours prior to surgery.  Do not bring valuables to the hospital.  [facility] is not   responsible for any belongings or valuables brought to the hospital.  Contacts, dentures or bridgework may not be worn into surgery.  Leave suitcase in the car. After surgery it may be brought to your room.  For patients admitted to the hospital, checkout time is 11:00 AM the day of              discharge.   Patients discharged the day of surgery will not be allowed to drive             home.  Name and phone number of your driver: NA  Special Instructions:      Please read over the following fact sheets that you were given:   Surgical Site Infection Prevention

## 2014-02-20 NOTE — H&P (Signed)
Melanie Barrett is a 38 y.o. female G3P1011 at 34 0/7 weeks (EDD 03/07/14 by 5 week [place]) presenting for scheduled repeat c-section for twin gestation and breech/breech presentation.  Twin pregnancy di/di with concordant growth, last [place] 8/7 showed babies 6#5ozx 2.  Prenatal care significant for: PCOS-conceived on metformin and clomid CHTN-on aldomet  po BID, then  Increased to  po BID on 01/23/14.  No PIH sx and 24 hours urine and labs WNL GDM--BS well-controlled on diet. Prior LTCS for LGA baby   Maternal Medical History:  Reason for admission: Nausea.  Contractions: Frequency: irregular.   Perceived severity is mild.    Fetal activity: Perceived fetal activity is normal.    Prenatal complications: PIH.   Prenatal Complications - Diabetes: gestational. Diabetes is managed by diet.      OB History   Grav Para Term Preterm Abortions TAB SAB Ect Mult Living   Primiary LTCS 2007 8#8oz SAB x 1  Past Medical History  Diagnosis Date  . Irregular menses   . Anxiety   . Depression   . Migraine   . Hx gestational diabetes   . PCOS (polycystic ovarian syndrome) 06/2010  . Hypertension   . Infertility, female   . Gestational diabetes mellitus, antepartum    Past Surgical History  Procedure Laterality Date  . Reduction mammaplasty  2001  . Hysteroscopy  03/2008    D&C  . Dilation and curettage of uterus  10/2009    SUCTION D&C FOR MISSED AB  . Cesarean section    . Tonsillectomy    . Anterior cruciate ligament repair      REPLACEMENT   Family History: family history includes Cancer in her other; Diabetes in her maternal grandmother; Hyperlipidemia in her other; Hypertension in her father; Obesity in her other. Social History:  reports that she has quit smoking. She has never used smokeless tobacco. She reports that she drinks alcohol. She reports that she does not use illicit drugs.   Prenatal Transfer Tool  Maternal Diabetes: Yes:   Diabetes Type:  Diet controlled Genetic Screening: Declined Maternal Ultrasounds/Referrals: Normal Fetal Ultrasounds or other Referrals:  None Maternal Substance Abuse:  No Significant Maternal Medications:  None Significant Maternal Lab Results:  None Other Comments:  None  Review of Systems  Gastrointestinal: Negative for nausea, vomiting and abdominal pain.  Neurological: Negative for headaches.      Last menstrual period 05/25/2013. Maternal Exam:  Uterine Assessment: Contraction strength is mild.  Contraction frequency is regular.   Abdomen: Patient reports no abdominal tenderness. Surgical scars: low transverse.   Fundal height is 52 cm.   Estimated fetal weight is 7 #, 7#.    Introitus: Normal vulva. Normal vagina.    Physical Exam  Constitutional: She is oriented to person, place, and time. She appears well-developed and well-nourished.  Cardiovascular: Normal rate and regular rhythm.   Respiratory: Effort normal.  GI: Soft.  Genitourinary: Vagina normal.  Neurological: She is alert and oriented to person, place, and time.  Psychiatric: She has a normal mood and affect.    Prenatal labs: ABO, Rh: --/--/B POS, B POS (08/26 0840) Antibody: NEG (08/26 0840) Rubella: Immune (02/12 0907) RPR: NON REAC (08/26 0840)  HBsAg: Negative (02/12 0907)  HIV: Non-reactive (02/12 0907)  GBS:   Negative One hour GTT  138 Three hour GTT 95/198/184/105 CF negative Declined genetics  Assessment/Plan: Patient was counseled regarding the risks and  benefits of C-section and the procedure was reviewed in detail. Risks of bleeding, infection, and possible damage to bowel and bladder were reviewed, The patient would accept a blood transfusion if needed. She desires to proceed.    Oliver Pila 02/20/2014, 11:58 AM

## 2014-02-21 ENCOUNTER — Inpatient Hospital Stay (HOSPITAL_COMMUNITY)
Admission: RE | Admit: 2014-02-21 | Discharge: 2014-02-23 | DRG: 765 | Disposition: A | Discharge status: Home / Self Care | Payer: 59 | Source: Ambulatory Visit | Attending: Obstetrics and Gynecology | Admitting: Obstetrics and Gynecology

## 2014-02-21 ENCOUNTER — Encounter (HOSPITAL_COMMUNITY): Payer: Self-pay | Admitting: Anesthesiology

## 2014-02-21 ENCOUNTER — Inpatient Hospital Stay (HOSPITAL_COMMUNITY): Payer: 59 | Admitting: Anesthesiology

## 2014-02-21 ENCOUNTER — Encounter (HOSPITAL_COMMUNITY)
Admission: RE | Disposition: A | Discharge status: Home / Self Care | Payer: Self-pay | Source: Ambulatory Visit | Attending: Obstetrics and Gynecology

## 2014-02-21 ENCOUNTER — Encounter (HOSPITAL_COMMUNITY): Payer: 59 | Admitting: Anesthesiology

## 2014-02-21 DIAGNOSIS — O30009 Twin pregnancy, unspecified number of placenta and unspecified number of amniotic sacs, unspecified trimester: Secondary | ICD-10-CM | POA: Diagnosis present

## 2014-02-21 DIAGNOSIS — O34219 Maternal care for unspecified type scar from previous cesarean delivery: Secondary | ICD-10-CM | POA: Diagnosis present

## 2014-02-21 DIAGNOSIS — O139 Gestational [pregnancy-induced] hypertension without significant proteinuria, unspecified trimester: Secondary | ICD-10-CM | POA: Diagnosis present

## 2014-02-21 DIAGNOSIS — O3090 Multiple gestation, unspecified, unspecified trimester: Principal | ICD-10-CM | POA: Diagnosis present

## 2014-02-21 DIAGNOSIS — O99344 Other mental disorders complicating childbirth: Secondary | ICD-10-CM | POA: Diagnosis present

## 2014-02-21 DIAGNOSIS — Z8249 Family history of ischemic heart disease and other diseases of the circulatory system: Secondary | ICD-10-CM

## 2014-02-21 DIAGNOSIS — O99814 Abnormal glucose complicating childbirth: Secondary | ICD-10-CM | POA: Diagnosis present

## 2014-02-21 DIAGNOSIS — Z87891 Personal history of nicotine dependence: Secondary | ICD-10-CM | POA: Diagnosis not present

## 2014-02-21 DIAGNOSIS — F341 Dysthymic disorder: Secondary | ICD-10-CM | POA: Diagnosis present

## 2014-02-21 DIAGNOSIS — O329XX0 Maternal care for malpresentation of fetus, unspecified, not applicable or unspecified: Secondary | ICD-10-CM | POA: Diagnosis present

## 2014-02-21 DIAGNOSIS — Z98891 History of uterine scar from previous surgery: Secondary | ICD-10-CM

## 2014-02-21 DIAGNOSIS — Z833 Family history of diabetes mellitus: Secondary | ICD-10-CM

## 2014-02-21 LAB — GLUCOSE, CAPILLARY
GLUCOSE-CAPILLARY: 76 mg/dL (ref 70–99)
Glucose-Capillary: 84 mg/dL (ref 70–99)

## 2014-02-21 LAB — PLATELET COUNT: Platelets: 144 10*3/uL — ABNORMAL LOW (ref 150–400)

## 2014-02-21 LAB — PREPARE RBC (CROSSMATCH)

## 2014-02-21 SURGERY — Surgical Case
Anesthesia: Spinal | Site: Abdomen

## 2014-02-21 MED ORDER — SIMETHICONE 80 MG PO CHEW
80.0000 mg | CHEWABLE_TABLET | Freq: Three times a day (TID) | ORAL | Status: DC
  Administered 2014-02-21 – 2014-02-23 (×7): 80 mg via ORAL
  Filled 2014-02-21 (×6): qty 1

## 2014-02-21 MED ORDER — CEFAZOLIN SODIUM-DEXTROSE 2-3 GM-% IV SOLR
INTRAVENOUS | Status: AC
  Administered 2014-02-21: 2 g via INTRAVENOUS
  Filled 2014-02-21: qty 50

## 2014-02-21 MED ORDER — DIPHENHYDRAMINE HCL 25 MG PO CAPS: 25.0000 mg | ORAL_CAPSULE | ORAL | Status: DC | PRN

## 2014-02-21 MED ORDER — DEXTROSE 5 % IV SOLN
1.0000 ug/kg/h | INTRAVENOUS | Status: DC | PRN
  Filled 2014-02-21: qty 2

## 2014-02-21 MED ORDER — WITCH HAZEL-GLYCERIN EX PADS: 1.0000 "application " | MEDICATED_PAD | CUTANEOUS | Status: DC | PRN

## 2014-02-21 MED ORDER — MEPERIDINE HCL 25 MG/ML IJ SOLN
6.2500 mg | INTRAMUSCULAR | Status: DC | PRN
  Administered 2014-02-21: 6.25 mg via INTRAVENOUS

## 2014-02-21 MED ORDER — HYDROMORPHONE HCL PF 1 MG/ML IJ SOLN
0.2500 mg | INTRAMUSCULAR | Status: DC | PRN
  Administered 2014-02-21 (×2): 0.5 mg via INTRAVENOUS

## 2014-02-21 MED ORDER — TETANUS-DIPHTH-ACELL PERTUSSIS 5-2.5-18.5 LF-MCG/0.5 IM SUSP
0.5000 mL | Freq: Once | INTRAMUSCULAR | Status: AC
  Administered 2014-02-22: 0.5 mL via INTRAMUSCULAR

## 2014-02-21 MED ORDER — SIMETHICONE 80 MG PO CHEW
80.0000 mg | CHEWABLE_TABLET | ORAL | Status: DC
  Administered 2014-02-22 – 2014-02-23 (×2): 80 mg via ORAL
  Filled 2014-02-21 (×3): qty 1

## 2014-02-21 MED ORDER — ONDANSETRON HCL 4 MG/2ML IJ SOLN
4.0000 mg | Freq: Three times a day (TID) | INTRAMUSCULAR | Status: DC | PRN
Start: 2014-02-21 — End: 2014-02-23

## 2014-02-21 MED ORDER — LACTATED RINGERS IV SOLN
INTRAVENOUS | Status: DC
  Administered 2014-02-21: 13:00:00 via INTRAVENOUS

## 2014-02-21 MED ORDER — ZOLPIDEM TARTRATE 5 MG PO TABS: 5.0000 mg | ORAL_TABLET | Freq: Every evening | ORAL | Status: DC | PRN

## 2014-02-21 MED ORDER — METHYLDOPA 500 MG PO TABS
500.0000 mg | ORAL_TABLET | Freq: Two times a day (BID) | ORAL | Status: DC
  Administered 2014-02-21 – 2014-02-23 (×4): 500 mg via ORAL
  Filled 2014-02-21 (×4): qty 1

## 2014-02-21 MED ORDER — KETOROLAC TROMETHAMINE 30 MG/ML IJ SOLN
INTRAMUSCULAR | Status: AC
  Administered 2014-02-21: 30 mg via INTRAVENOUS
  Filled 2014-02-21: qty 1

## 2014-02-21 MED ORDER — ONDANSETRON HCL 4 MG/2ML IJ SOLN
INTRAMUSCULAR | Status: DC | PRN
  Administered 2014-02-21: 4 mg via INTRAVENOUS

## 2014-02-21 MED ORDER — ONDANSETRON HCL 4 MG PO TABS: 4.0000 mg | ORAL_TABLET | ORAL | Status: DC | PRN

## 2014-02-21 MED ORDER — METOCLOPRAMIDE HCL 5 MG/ML IJ SOLN: 10.0000 mg | Freq: Three times a day (TID) | INTRAMUSCULAR | Status: DC | PRN

## 2014-02-21 MED ORDER — KETOROLAC TROMETHAMINE 30 MG/ML IJ SOLN
30.0000 mg | Freq: Four times a day (QID) | INTRAMUSCULAR | Status: AC | PRN
  Administered 2014-02-21: 30 mg via INTRAVENOUS
  Filled 2014-02-21: qty 1

## 2014-02-21 MED ORDER — NALOXONE HCL 0.4 MG/ML IJ SOLN: 0.4000 mg | INTRAMUSCULAR | Status: DC | PRN

## 2014-02-21 MED ORDER — PHENYLEPHRINE 8 MG IN D5W 100 ML (0.08MG/ML) PREMIX OPTIME
INJECTION | INTRAVENOUS | Status: AC
  Filled 2014-02-21: qty 100

## 2014-02-21 MED ORDER — ONDANSETRON HCL 4 MG/2ML IJ SOLN
INTRAMUSCULAR | Status: AC
  Filled 2014-02-21: qty 2

## 2014-02-21 MED ORDER — FENTANYL CITRATE 0.05 MG/ML IJ SOLN
INTRAMUSCULAR | Status: AC
  Filled 2014-02-21: qty 2

## 2014-02-21 MED ORDER — CEFAZOLIN SODIUM-DEXTROSE 2-3 GM-% IV SOLR: 2.0000 g | INTRAVENOUS | Status: DC

## 2014-02-21 MED ORDER — DIBUCAINE 1 % RE OINT: 1.0000 "application " | TOPICAL_OINTMENT | RECTAL | Status: DC | PRN

## 2014-02-21 MED ORDER — PHENYLEPHRINE 8 MG IN D5W 100 ML (0.08MG/ML) PREMIX OPTIME
INJECTION | INTRAVENOUS | Status: DC | PRN
  Administered 2014-02-21: 60 ug/min via INTRAVENOUS

## 2014-02-21 MED ORDER — MORPHINE SULFATE 0.5 MG/ML IJ SOLN
INTRAMUSCULAR | Status: AC
  Filled 2014-02-21: qty 10

## 2014-02-21 MED ORDER — OXYCODONE-ACETAMINOPHEN 5-325 MG PO TABS
1.0000 | ORAL_TABLET | ORAL | Status: DC | PRN
  Administered 2014-02-22 (×2): 2 via ORAL
  Administered 2014-02-23: 1 via ORAL
  Administered 2014-02-23: 2 via ORAL
  Filled 2014-02-21: qty 1
  Filled 2014-02-21 (×4): qty 2

## 2014-02-21 MED ORDER — SCOPOLAMINE 1 MG/3DAYS TD PT72
1.0000 | MEDICATED_PATCH | Freq: Once | TRANSDERMAL | Status: DC
  Administered 2014-02-21: 1.5 mg via TRANSDERMAL

## 2014-02-21 MED ORDER — DIPHENHYDRAMINE HCL 50 MG/ML IJ SOLN: 12.5000 mg | INTRAMUSCULAR | Status: DC | PRN

## 2014-02-21 MED ORDER — MEPERIDINE HCL 25 MG/ML IJ SOLN: 6.2500 mg | INTRAMUSCULAR | Status: DC | PRN

## 2014-02-21 MED ORDER — NALBUPHINE HCL 10 MG/ML IJ SOLN
5.0000 mg | INTRAMUSCULAR | Status: DC | PRN
  Administered 2014-02-21: 10 mg via SUBCUTANEOUS

## 2014-02-21 MED ORDER — DIPHENHYDRAMINE HCL 25 MG PO CAPS: 25.0000 mg | ORAL_CAPSULE | Freq: Four times a day (QID) | ORAL | Status: DC | PRN

## 2014-02-21 MED ORDER — NALBUPHINE HCL 10 MG/ML IJ SOLN
INTRAMUSCULAR | Status: AC
  Filled 2014-02-21: qty 1

## 2014-02-21 MED ORDER — ONDANSETRON HCL 4 MG/2ML IJ SOLN: 4.0000 mg | INTRAMUSCULAR | Status: DC | PRN

## 2014-02-21 MED ORDER — LACTATED RINGERS IV SOLN
INTRAVENOUS | Status: DC
  Administered 2014-02-21 (×3): via INTRAVENOUS

## 2014-02-21 MED ORDER — OXYTOCIN 40 UNITS IN LACTATED RINGERS INFUSION - SIMPLE MED: 62.5000 mL/h | INTRAVENOUS | Status: AC

## 2014-02-21 MED ORDER — BUPIVACAINE IN DEXTROSE 0.75-8.25 % IT SOLN
INTRATHECAL | Status: AC
  Filled 2014-02-21: qty 2

## 2014-02-21 MED ORDER — SENNOSIDES-DOCUSATE SODIUM 8.6-50 MG PO TABS
2.0000 | ORAL_TABLET | ORAL | Status: DC
  Administered 2014-02-22 – 2014-02-23 (×2): 2 via ORAL
  Filled 2014-02-21 (×2): qty 2

## 2014-02-21 MED ORDER — NALBUPHINE HCL 10 MG/ML IJ SOLN: 5.0000 mg | INTRAMUSCULAR | Status: DC | PRN

## 2014-02-21 MED ORDER — SCOPOLAMINE 1 MG/3DAYS TD PT72
MEDICATED_PATCH | TRANSDERMAL | Status: AC
  Administered 2014-02-21: 1.5 mg via TRANSDERMAL
  Filled 2014-02-21: qty 1

## 2014-02-21 MED ORDER — CEFAZOLIN SODIUM-DEXTROSE 2-3 GM-% IV SOLR
INTRAVENOUS | Status: AC
  Filled 2014-02-21: qty 50

## 2014-02-21 MED ORDER — LANOLIN HYDROUS EX OINT: 1.0000 "application " | TOPICAL_OINTMENT | CUTANEOUS | Status: DC | PRN

## 2014-02-21 MED ORDER — OXYTOCIN 10 UNIT/ML IJ SOLN
INTRAMUSCULAR | Status: DC | PRN
  Administered 2014-02-21: 40 [IU]

## 2014-02-21 MED ORDER — MORPHINE SULFATE (PF) 0.5 MG/ML IJ SOLN
INTRAMUSCULAR | Status: DC | PRN
  Administered 2014-02-21: .1 mg via EPIDURAL

## 2014-02-21 MED ORDER — SODIUM CHLORIDE 0.9 % IJ SOLN: 3.0000 mL | INTRAMUSCULAR | Status: DC | PRN

## 2014-02-21 MED ORDER — IBUPROFEN 600 MG PO TABS
600.0000 mg | ORAL_TABLET | Freq: Four times a day (QID) | ORAL | Status: DC
  Administered 2014-02-22 – 2014-02-23 (×6): 600 mg via ORAL
  Filled 2014-02-21 (×7): qty 1

## 2014-02-21 MED ORDER — DIPHENHYDRAMINE HCL 50 MG/ML IJ SOLN: 25.0000 mg | INTRAMUSCULAR | Status: DC | PRN

## 2014-02-21 MED ORDER — SCOPOLAMINE 1 MG/3DAYS TD PT72
1.0000 | MEDICATED_PATCH | Freq: Once | TRANSDERMAL | Status: DC
  Filled 2014-02-21: qty 1

## 2014-02-21 MED ORDER — KETOROLAC TROMETHAMINE 30 MG/ML IJ SOLN: 30.0000 mg | Freq: Four times a day (QID) | INTRAMUSCULAR | Status: AC | PRN

## 2014-02-21 MED ORDER — KETOROLAC TROMETHAMINE 30 MG/ML IJ SOLN
15.0000 mg | Freq: Once | INTRAMUSCULAR | Status: AC | PRN
  Administered 2014-02-21: 30 mg via INTRAVENOUS

## 2014-02-21 MED ORDER — PROMETHAZINE HCL 25 MG/ML IJ SOLN: 6.2500 mg | INTRAMUSCULAR | Status: DC | PRN

## 2014-02-21 MED ORDER — SIMETHICONE 80 MG PO CHEW: 80.0000 mg | CHEWABLE_TABLET | ORAL | Status: DC | PRN

## 2014-02-21 MED ORDER — PHENYLEPHRINE 40 MCG/ML (10ML) SYRINGE FOR IV PUSH (FOR BLOOD PRESSURE SUPPORT)
PREFILLED_SYRINGE | INTRAVENOUS | Status: AC
  Filled 2014-02-21: qty 5

## 2014-02-21 MED ORDER — PRENATAL MULTIVITAMIN CH
1.0000 | ORAL_TABLET | Freq: Every day | ORAL | Status: DC
  Administered 2014-02-22: 1 via ORAL
  Filled 2014-02-21 (×2): qty 1

## 2014-02-21 MED ORDER — IBUPROFEN 600 MG PO TABS: 600.0000 mg | ORAL_TABLET | Freq: Four times a day (QID) | ORAL | Status: DC | PRN

## 2014-02-21 MED ORDER — LACTATED RINGERS IV SOLN
INTRAVENOUS | Status: DC
  Administered 2014-02-21: 08:00:00 via INTRAVENOUS

## 2014-02-21 MED ORDER — MEPERIDINE HCL 25 MG/ML IJ SOLN
INTRAMUSCULAR | Status: AC
  Filled 2014-02-21: qty 1

## 2014-02-21 MED ORDER — OXYTOCIN 10 UNIT/ML IJ SOLN
INTRAMUSCULAR | Status: AC
  Filled 2014-02-21: qty 4

## 2014-02-21 MED ORDER — MENTHOL 3 MG MT LOZG
1.0000 | LOZENGE | OROMUCOSAL | Status: DC | PRN
  Filled 2014-02-21: qty 9

## 2014-02-21 MED ORDER — HYDROMORPHONE HCL PF 1 MG/ML IJ SOLN
INTRAMUSCULAR | Status: AC
  Filled 2014-02-21: qty 1

## 2014-02-21 SURGICAL SUPPLY — 40 items
APL SKNCLS STERI-STRIP NONHPOA (GAUZE/BANDAGES/DRESSINGS) ×1
BENZOIN TINCTURE PRP APPL 2/3 (GAUZE/BANDAGES/DRESSINGS) ×2 IMPLANT
BLADE SURG 10 STRL SS (BLADE) ×6 IMPLANT
CLAMP CORD UMBIL (MISCELLANEOUS) IMPLANT
CLOSURE WOUND 1/2 X4 (GAUZE/BANDAGES/DRESSINGS) ×1
CLOTH BEACON ORANGE TIMEOUT ST (SAFETY) ×3 IMPLANT
DRAPE LG THREE QUARTER DISP (DRAPES) IMPLANT
DRSG OPSITE POSTOP 4X10 (GAUZE/BANDAGES/DRESSINGS) ×3 IMPLANT
DURAPREP 26ML APPLICATOR (WOUND CARE) ×3 IMPLANT
ELECT REM PT RETURN 9FT ADLT (ELECTROSURGICAL) ×3
ELECTRODE REM PT RTRN 9FT ADLT (ELECTROSURGICAL) ×1 IMPLANT
EXTRACTOR VACUUM KIWI (MISCELLANEOUS) IMPLANT
GLOVE BIO SURGEON STRL SZ 6.5 (GLOVE) ×2 IMPLANT
GLOVE BIO SURGEONS STRL SZ 6.5 (GLOVE) ×1
GOWN STRL REUS W/TWL LRG LVL3 (GOWN DISPOSABLE) ×6 IMPLANT
KIT ABG SYR 3ML LUER SLIP (SYRINGE) IMPLANT
NDL HYPO 25X5/8 SAFETYGLIDE (NEEDLE) IMPLANT
NDL SAFETY ECLIPSE 18X1.5 (NEEDLE) ×1 IMPLANT
NEEDLE HYPO 18GX1.5 SHARP (NEEDLE) ×3
NEEDLE HYPO 25X5/8 SAFETYGLIDE (NEEDLE) IMPLANT
NS IRRIG 1000ML POUR BTL (IV SOLUTION) ×3 IMPLANT
PACK C SECTION WH (CUSTOM PROCEDURE TRAY) ×3 IMPLANT
PAD OB MATERNITY 4.3X12.25 (PERSONAL CARE ITEMS) ×3 IMPLANT
RTRCTR C-SECT PINK 25CM LRG (MISCELLANEOUS) ×3 IMPLANT
STRIP CLOSURE SKIN 1/2X4 (GAUZE/BANDAGES/DRESSINGS) ×1 IMPLANT
SUT CHROMIC 1 CTX 36 (SUTURE) ×6 IMPLANT
SUT PLAIN 0 NONE (SUTURE) IMPLANT
SUT PLAIN 2 0 XLH (SUTURE) ×3 IMPLANT
SUT VIC AB 0 CT1 27 (SUTURE) ×6
SUT VIC AB 0 CT1 27XBRD ANBCTR (SUTURE) ×2 IMPLANT
SUT VIC AB 2-0 CT1 27 (SUTURE) ×3
SUT VIC AB 2-0 CT1 TAPERPNT 27 (SUTURE) ×1 IMPLANT
SUT VIC AB 3-0 CT1 27 (SUTURE)
SUT VIC AB 3-0 CT1 TAPERPNT 27 (SUTURE) IMPLANT
SUT VIC AB 4-0 KS 27 (SUTURE) ×3 IMPLANT
SYR BULB 3OZ (MISCELLANEOUS) ×3 IMPLANT
SYRINGE 10CC LL (SYRINGE) ×3 IMPLANT
TOWEL OR 17X24 6PK STRL BLUE (TOWEL DISPOSABLE) ×3 IMPLANT
TRAY FOLEY CATH 14FR (SET/KITS/TRAYS/PACK) ×3 IMPLANT
WATER STERILE IRR 1000ML POUR (IV SOLUTION) ×3 IMPLANT

## 2014-02-21 NOTE — Transfer of Care (Signed)
Immediate Anesthesia Transfer of Care Note  Patient: Melanie Barrett  Procedure(s) Performed: Procedure(s) with comments: CESAREAN SECTION MULTI-GESTATIONAL (N/A) - 1 1/2hrs OR time  Patient Location: PACU  Anesthesia Type:Spinal  Level of Consciousness: awake, alert , oriented and patient cooperative  Airway & Oxygen Therapy: Patient Spontanous Breathing  Post-op Assessment: Report given to PACU RN and Post -op Vital signs reviewed and stable  Post vital signs: Reviewed and stable  Complications: No apparent anesthesia complications

## 2014-02-21 NOTE — Op Note (Signed)
Operative note  Preoperative diagnosis Term pregnancy at 38-0/7 weeks Twin gestation Breech/ breech presentation Prior C-section Gestational diabetes Chronic hypertension  Postoperative diagnosis Same  Procedure Repeat low transverse C-section with 2 layer closure of uterus  Surgeon Dr. Huel Cote Dr. Tawanna Cooler Meisinger  Anesthesia Spinal  Specimen Both placentas were sent to pathology  Findings There were viable female twin infants, both in the footling breech presentation. Twin A had Apgars of 8 and 9 with weight pending at time of dictation. Twin B also had Apgars of 8 and 9 and weight was pending as well. Uterus tubes and ovaries appeared normal.  Estimated blood loss 800 cc Urine output 200 cc clear  IV fluid 2200 cc LR  Procedure note Patient was taken to the operating room where spinal anesthesia was obtained without difficulty and found to be adequate by Allis clamp test. She was prepped and draped in the normal sterile fashion in the dorsal supine position with a leftward tilt. An appropriate time out was performed. A Pfannenstiel skin incision was then made with the scalpel through pre-existing scar and carried through to the underlying layer of fascia by sharp dissection and Bovie cautery. The fascia was nicked in the midline and the incision was extended laterally with Mayo scissors.  The superior aspect was dissected off the rectus muscles. Rectus muscles were separated in the midline and the peritoneal cavity entered bluntly. The peritoneal incision was then extended both superiorly and inferiorly with careful attention to avoid both bowel and bladder. The Alexis self-retaining wound retractor was then placed within the incision and the lower uterine segment exposed. The bladder flap was developed with Metzenbaum scissors and pushed away from the lower uterine segment. The lower uterine segment was then incised in a transverse fashion and the cavity itself entered  bluntly. The incision was extended bluntly. The infant's head was then lifted and delivered from the incision without difficulty. The remainder of the infant delivered and the nose and mouth bulb suctioned with the cord clamped and cut as well. The infant was handed off to the waiting pediatricians. The placenta was then spontaneously expressed from the uterus and the uterus cleared of all clots and debris with moist lap sponge. The uterine incision was then repaired in 2 layers the first layer was a running locked layer 1-0 chromic and the second an imbricating layer of the same suture. The tubes and ovaries were inspected and the gutters cleared of all clots and debris. The uterine incision was inspected and found to be hemostatic. All instruments and sponges as well as the Alexis retractor were then removed from the abdomen. The rectus muscles and peritoneum were then reapproximated with several interrupted mattress sutures of 2-0 Vicryl. The fascia was then closed with 0 Vicryl in a running fashion. Subcutaneous tissue was reapproximated with 3-0 plain in a running fashion. The skin was closed with a subcuticular stitch of 4-0 Vicryl on a Keith needle and then reinforced with benzoin and Steri-Strips. At the conclusion of the procedure all instruments and sponge counts were correct. Patient was taken to the recovery room in good condition with her baby accompanying her skin to skin.

## 2014-02-21 NOTE — Anesthesia Procedure Notes (Signed)
Spinal  Patient location during procedure: OR Preanesthetic Checklist Completed: patient identified, site marked, surgical consent, pre-op evaluation, timeout performed, IV checked, risks and benefits discussed and monitors and equipment checked Spinal Block Patient position: sitting Prep: DuraPrep Patient monitoring: heart rate, cardiac monitor, continuous pulse ox and blood pressure Approach: midline Location: L3-4 Injection technique: single-shot Needle Needle type: Sprotte  Needle gauge: 24 G Needle length: 9 cm Assessment Sensory level: T3 Additional Notes Spinal Dosage in OR  Bupivicaine ml       1.4 PFMS04   mcg        100

## 2014-02-21 NOTE — Progress Notes (Signed)
Patient ID: Melanie Barrett, female   DOB: 1975/10/06, 38 y.o.   MRN: [id number] Per pt no changes in dictated H&P.  Brief exam WNL.  BP 150/90"s  No PIH sx.

## 2014-02-21 NOTE — Anesthesia Postprocedure Evaluation (Signed)
Anesthesia Post Note  Patient: Melanie Barrett  Procedure(s) Performed: Procedure(s) (LRB): CESAREAN SECTION MULTI-GESTATIONAL (N/A)  Anesthesia type: Spinal  Patient location: Mother/Baby  Post pain: Pain level controlled  Post assessment: Post-op Vital signs reviewed  Last Vitals:  Filed Vitals:   02/21/14 1419  BP: 122/80  Pulse: 75  Temp: 37.1 C  Resp: 16    Post vital signs: Reviewed  Level of consciousness: awake  Complications: No apparent anesthesia complications

## 2014-02-21 NOTE — Anesthesia Preprocedure Evaluation (Addendum)
Anesthesia Evaluation  Patient identified by MRN, date of birth, ID band Patient awake    Reviewed: Allergy & Precautions, H&P , NPO status , Patient's Chart, lab work & pertinent test results  Airway Mallampati: II TM Distance: >3 FB Neck ROM: full    Dental no notable dental hx.    Pulmonary former smoker,    Pulmonary exam normal       Cardiovascular hypertension, Pt. on medications     Neuro/Psych    GI/Hepatic negative GI ROS, Neg liver ROS,   Endo/Other  diabetes  Renal/GU negative Renal ROS     Musculoskeletal   Abdominal Normal abdominal exam  (+)   Peds  Hematology negative hematology ROS (+)   Anesthesia Other Findings   Reproductive/Obstetrics (+) Pregnancy                         Anesthesia Physical Anesthesia Plan  ASA: III  Anesthesia Plan: Spinal   Post-op Pain Management:    Induction:   Airway Management Planned:   Additional Equipment:   Intra-op Plan:   Post-operative Plan:   Informed Consent: I have reviewed the patients History and Physical, chart, labs and discussed the procedure including the risks, benefits and alternatives for the proposed anesthesia with the patient or authorized representative who has indicated his/her understanding and acceptance.     Plan Discussed with: CRNA, Anesthesiologist and Surgeon  Anesthesia Plan Comments: (Patient had a high level last time as well as post-op opiod induced pruritis.)      Anesthesia Quick Evaluation

## 2014-02-21 NOTE — Anesthesia Postprocedure Evaluation (Signed)
  Anesthesia Post-op Note  Anesthesia Post Note  Patient: Melanie Barrett  Procedure(s) Performed: Procedure(s) (LRB): CESAREAN SECTION MULTI-GESTATIONAL (N/A)  Anesthesia type: Spinal  Patient location: PACU  Post pain: Pain level controlled  Post assessment: Post-op Vital signs reviewed  Last Vitals:  Filed Vitals:   02/21/14 1027  BP:   Pulse: 71  Temp: 36.7 C  Resp: 18    Post vital signs: Reviewed  Level of consciousness: awake  Complications: No apparent anesthesia complications

## 2014-02-21 NOTE — Addendum Note (Signed)
Addendum created 02/21/14 1542 by Algis Greenhouse, CRNA   Modules edited: Notes Section   Notes Section:  File: [id number]

## 2014-02-22 LAB — CBC
HEMATOCRIT: 26.4 % — AB (ref 36.0–46.0)
Hemoglobin: 9.1 g/dL — ABNORMAL LOW (ref 12.0–15.0)
MCH: 30.6 pg (ref 26.0–34.0)
MCHC: 34.5 g/dL (ref 30.0–36.0)
MCV: 88.9 fL (ref 78.0–100.0)
PLATELETS: 138 10*3/uL — AB (ref 150–400)
RBC: 2.97 MIL/uL — ABNORMAL LOW (ref 3.87–5.11)
RDW: 16.9 % — ABNORMAL HIGH (ref 11.5–15.5)
WBC: 8.6 10*3/uL (ref 4.0–10.5)

## 2014-02-22 NOTE — Progress Notes (Signed)
Clinical Social Work Department PSYCHOSOCIAL ASSESSMENT - MATERNAL/CHILD 02/22/2014  Patient:  Melanie Barrett, Melanie Barrett  Account Number:  [id number]  Admit Date:  02/21/2014  Marjo Bicker Name:   Twin A: Joyice Faster  Twin B: Morton Amy    Clinical Social Worker:  Macaulay Reicher, LCSW   Date/Time:  02/22/2014 10:00 AM  Date Referred:  02/21/2014   Referral source  Central Nursery     Referred reason  Depression/Anxiety   Other referral source:    I:  FAMILY / HOME ENVIRONMENT Child's legal guardian:  PARENT  Guardian - Name Guardian - Age Guardian - Address  SCHEIER-CROWDER,Karmela [address]  [place], [place] [address]  Orson Aloe  same as above   Other household support members/support persons Other support:   Extensive family support reported    II  PSYCHOSOCIAL DATA Information Source:    Event organiser Employment:   Both parents employed   Surveyor, quantity resources:  Media planner If [employer] - [place]:    School / Grade:   Maternity Care Coordinator / Child Services Coordination / Early Interventions:  Cultural issues impacting care:    III  STRENGTHS Strengths  Supportive family/friends  Home prepared for Child (including basic supplies)  Adequate Resources   Strength comment:    IV  RISK FACTORS AND CURRENT PROBLEMS Current Problem:     Risk Factor & Current Problem Patient Issue Family Issue Risk Factor / Current Problem Comment  Mental Illness Y N Mother has hx of depression and anxiety         V  SOCIAL WORK ASSESSMENT Acknowledged order for Social Work consult to assess mother's history of anxiety and depression.  Parents are married, and have one other dependent age 59.  Mother states that she has hx of depression and anxiety since high school and has been on a low dose of Prozac for years.  Informed that she stopped taking the medication during the pregnancy, but plans to speak with her PCP regarding restarting the mediation.   No acute symptoms of depression or anxiety reported at this time.   She denies any hx of psychiatric hospitalization or outpatient therapy.  Mother reports extensive family support.   She denies any hx of substance abuse.  No acute social concerns related at this time.    Mother informed of social work Surveyor, mining.      VI SOCIAL WORK PLAN Social Work Plan  No Further Intervention Required / No Barriers to Discharge   Type of pt/family education:   PP Depression Information

## 2014-02-22 NOTE — Progress Notes (Signed)
Subjective: Postpartum Day 1: Cesarean Delivery Patient reports incisional pain and tolerating PO.  Nl lochia, pain controlled  Objective: Vital signs in last 24 hours: Temp:  [98 F (36.7 C)-99.3 F (37.4 C)] 99.1 F (37.3 C) (08/29 0009) Pulse Rate:  [60-96] 78 (08/29 0439) Resp:  [15-20] 16 (08/29 0439) BP: (113-151)/(69-97) 133/71 mmHg (08/29 0439) SpO2:  [94 %-100 %] 94 % (08/29 0439) Weight:  [105.688 kg (233 lb)] 105.688 kg (233 lb) (08/28 1100)  Physical Exam:  General: alert and no distress Lochia: appropriate Uterine Fundus: firm Incision: healing well DVT Evaluation: No evidence of DVT seen on physical exam.   Recent Labs  02/19/14 0840 02/22/14 0546  HGB 11.1* 9.1*  HCT 32.3* 26.4*    Assessment/Plan: Status post Cesarean section. Doing well postoperatively.  Continue current care.  Bovard-Stuckert, Kail Fraley 02/22/2014, 8:34 AM

## 2014-02-23 LAB — TYPE AND SCREEN
ABO/RH(D): B POS
ANTIBODY SCREEN: NEGATIVE
UNIT DIVISION: 0
Unit division: 0

## 2014-02-23 MED ORDER — IBUPROFEN 600 MG PO TABS: 600.0000 mg | ORAL_TABLET | Freq: Four times a day (QID) | ORAL | Status: AC | PRN

## 2014-02-23 MED ORDER — PRENATAL MULTIVITAMIN CH: 1.0000 | ORAL_TABLET | Freq: Every day | ORAL | Status: AC

## 2014-02-23 MED ORDER — OXYCODONE-ACETAMINOPHEN 5-325 MG PO TABS: 1.0000 | ORAL_TABLET | Freq: Four times a day (QID) | ORAL | Status: AC | PRN

## 2014-02-23 NOTE — Discharge Summary (Signed)
Obstetric Discharge Summary Reason for Admission: cesarean section Prenatal Procedures: none Intrapartum Procedures: cesarean: low cervical, transverse Postpartum Procedures: none Complications-Operative and Postpartum: none Hemoglobin  Date Value Ref Range Status  02/22/2014 9.1* 12.0 - 15.0 g/dL Final     HCT  Date Value Ref Range Status  02/22/2014 26.4* 36.0 - 46.0 % Final    Physical Exam:  General: alert and no distress Lochia: appropriate Uterine Fundus: firm Incision: healing well DVT Evaluation: No evidence of DVT seen on physical exam.  Discharge Diagnoses: Term Pregnancy-delivered  Discharge Information: Date: 02/23/2014 Activity: pelvic rest Diet: routine Medications: PNV, Ibuprofen, Percocet and Aldomet Condition: stable Instructions: refer to practice specific booklet Discharge to: home Follow-up Information   Follow up with Oliver Pila, MD. Schedule an appointment as soon as possible for a visit in 2 weeks. (Incision Check, then 6 wks for full postpartum check)    Specialty:  Obstetrics and Gynecology   Contact information:   [address] [place] [address] [phone]       Newborn Data:   Scheier-Crowder, Girl Mylena [[id number]]  Live born female  Birth Weight: 8 lb 13.4 oz (4009 g) APGAR: 9, 9   Scheier-Crowder, ANJOLAOLUWA SIGUENZA [[id number]]  Live born female  Birth Weight: 8 lb 0.2 oz (3634 g) APGAR: 9, 9  Home with mother.  Bovard-Stuckert, Chattie Greeson 02/23/2014, 8:12 AM

## 2014-02-23 NOTE — Progress Notes (Signed)
Subjective: Postpartum Day 2: Cesarean Delivery Patient reports incisional pain and tolerating PO.  Nl lochia, pain controlled  Objective: Vital signs in last 24 hours: Temp:  [97.7 F (36.5 C)-98.8 F (37.1 C)] 97.7 F (36.5 C) (08/30 0550) Pulse Rate:  [72-81] 79 (08/30 0550) Resp:  [19-20] 19 (08/30 0550) BP: (137-152)/(79-92) 137/92 mmHg (08/30 0550) SpO2:  [97 %-99 %] 99 % (08/30 0550)  Physical Exam:  General: alert and no distress Lochia: appropriate Uterine Fundus: firm Incision: healing well DVT Evaluation: No evidence of DVT seen on physical exam.   Recent Labs  02/22/14 0546  HGB 9.1*  HCT 26.4*    Assessment/Plan: Status post Cesarean section. Doing well postoperatively.  Pt desires early d/c.  Will d/c with Motrin, percocet, and PNV, f/u 2 and 6 weeks.    Bovard-Stuckert, Jareth Pardee 02/23/2014, 7:56 AM

## 2014-04-28 ENCOUNTER — Encounter (HOSPITAL_COMMUNITY): Payer: Self-pay | Admitting: Anesthesiology

## 2015-01-30 ENCOUNTER — Other Ambulatory Visit: Payer: Self-pay

## 2017-11-24 ENCOUNTER — Other Ambulatory Visit (HOSPITAL_COMMUNITY): Payer: Self-pay | Admitting: General Surgery

## 2017-12-11 ENCOUNTER — Ambulatory Visit (HOSPITAL_COMMUNITY): Payer: Self-pay

## 2017-12-11 ENCOUNTER — Encounter (HOSPITAL_COMMUNITY): Payer: Self-pay

## 2017-12-18 ENCOUNTER — Encounter: Payer: Self-pay | Admitting: Skilled Nursing Facility1

## 2017-12-18 ENCOUNTER — Encounter: Payer: Self-pay | Attending: General Surgery | Admitting: Skilled Nursing Facility1

## 2017-12-18 DIAGNOSIS — G4733 Obstructive sleep apnea (adult) (pediatric): Secondary | ICD-10-CM | POA: Insufficient documentation

## 2017-12-18 DIAGNOSIS — Z79899 Other long term (current) drug therapy: Secondary | ICD-10-CM | POA: Insufficient documentation

## 2017-12-18 DIAGNOSIS — F419 Anxiety disorder, unspecified: Secondary | ICD-10-CM | POA: Insufficient documentation

## 2017-12-18 DIAGNOSIS — F329 Major depressive disorder, single episode, unspecified: Secondary | ICD-10-CM | POA: Insufficient documentation

## 2017-12-18 DIAGNOSIS — Z6839 Body mass index (BMI) 39.0-39.9, adult: Secondary | ICD-10-CM | POA: Insufficient documentation

## 2017-12-18 DIAGNOSIS — Z882 Allergy status to sulfonamides status: Secondary | ICD-10-CM | POA: Insufficient documentation

## 2017-12-18 DIAGNOSIS — E782 Mixed hyperlipidemia: Secondary | ICD-10-CM | POA: Insufficient documentation

## 2017-12-18 DIAGNOSIS — I10 Essential (primary) hypertension: Secondary | ICD-10-CM | POA: Insufficient documentation

## 2017-12-18 DIAGNOSIS — E669 Obesity, unspecified: Secondary | ICD-10-CM

## 2017-12-18 DIAGNOSIS — Z713 Dietary counseling and surveillance: Secondary | ICD-10-CM | POA: Insufficient documentation

## 2017-12-18 DIAGNOSIS — Z8261 Family history of arthritis: Secondary | ICD-10-CM | POA: Insufficient documentation

## 2017-12-18 DIAGNOSIS — Z87891 Personal history of nicotine dependence: Secondary | ICD-10-CM | POA: Insufficient documentation

## 2017-12-18 DIAGNOSIS — Z8249 Family history of ischemic heart disease and other diseases of the circulatory system: Secondary | ICD-10-CM | POA: Insufficient documentation

## 2017-12-18 NOTE — Progress Notes (Signed)
Pre-Op Assessment Visit:  Pre-Operative Sleeve Gastrectomy Surgery  Patient was seen on 12/18/2017 for Pre-Operative Nutrition Assessment. [facility] on 12/18/2017.   Pt states she does not wear her C-PAP. Pt states she never felt more energized even from wearing her C-PAP. Pt states she sleeps about 9 hours and also needing a nap at least 1 time a day for about 2 hours a night. Pt states she believes herself to deal with stress using sleep. Pt states she thinks she naps when she has nothing else to do. Pt states her husband is a very picky eater. Pt states her family do not eat together. Pt states her twin daughter is bigger.   Pt seems to have a positive attitude about surgery and ready to make neccessary changes.   Pt expectation of surgery: controlled sleep apnea.   Pt expectation of Dietitian: none  Start weight at [facility]: 244.6 BMI: 39.48  24 hr Dietary Recall: First Meal: bread and eggs Snack:  Second Meal: grapenuts cereal with banana or sandiwch Snack: cheese its Third Meal:  bread and eggs Snack:  Beverages:   Encouraged to engage in 150 minutes of moderate physical activity including cardiovascular and weight baring weekly  Handouts given during visit include:  . Pre-Op Goals . Bariatric Surgery Protein Shakes During the appointment today the following Pre-Op Goals were reviewed with the patient: . Maintain or lose weight as instructed by your surgeon . Make healthy food choices . Begin to limit portion sizes . Limited concentrated sugars and fried foods . Keep fat/sugar in the single digits per serving on             food labels . Practice CHEWING your food  (aim for 30 chews per bite or until applesauce consistency) . Practice not drinking 15 minutes before, during, and 30 minutes after each meal/snack . Avoid all carbonated beverages  . Avoid/limit caffeinated beverages   . Avoid all sugar-sweetened beverages . Consume 3 meals per day; eat every 3-5 hours . Make a list of non-food related activities . Aim for 64-100 ounces of FLUID daily  . Aim for at least 60-80 grams of PROTEIN daily . Look for a liquid protein source that contain ?15 g protein and ?5 g carbohydrate  (ex: shakes, drinks, shots) . Do not body sahme in front of your children  -Follow diet recommendations listed below   Energy and Macronutrient Recomendations: Calories: 1400 Carbohydrate: 158 Protein: 105 Fat: 39  Demonstrated degree of understanding via:  Teach Back  Teaching Method Utilized:  Visual Auditory Hands on  Barriers to learning/adherence to lifestyle change: none identified   Patient to call the [facility] to enroll in Pre-Op and Post-Op Nutrition Education when surgery date is scheduled.

## 2018-01-17 ENCOUNTER — Ambulatory Visit (HOSPITAL_COMMUNITY)
Admission: RE | Admit: 2018-01-17 | Discharge: 2018-01-17 | Disposition: A | Discharge status: Home / Self Care | Payer: Managed Care, Other (non HMO) | Source: Ambulatory Visit | Attending: General Surgery | Admitting: General Surgery

## 2018-01-17 ENCOUNTER — Other Ambulatory Visit: Payer: Self-pay

## 2018-01-17 DIAGNOSIS — Z8249 Family history of ischemic heart disease and other diseases of the circulatory system: Secondary | ICD-10-CM | POA: Diagnosis not present

## 2018-01-17 DIAGNOSIS — Z8632 Personal history of gestational diabetes: Secondary | ICD-10-CM | POA: Insufficient documentation

## 2018-01-17 DIAGNOSIS — Z87891 Personal history of nicotine dependence: Secondary | ICD-10-CM | POA: Insufficient documentation

## 2018-01-17 DIAGNOSIS — Z820 Family history of epilepsy and other diseases of the nervous system: Secondary | ICD-10-CM | POA: Insufficient documentation

## 2018-01-17 DIAGNOSIS — E282 Polycystic ovarian syndrome: Secondary | ICD-10-CM | POA: Diagnosis not present

## 2018-01-17 DIAGNOSIS — Z8261 Family history of arthritis: Secondary | ICD-10-CM | POA: Diagnosis not present

## 2018-01-17 DIAGNOSIS — G4733 Obstructive sleep apnea (adult) (pediatric): Secondary | ICD-10-CM | POA: Diagnosis not present

## 2018-01-17 DIAGNOSIS — Z79899 Other long term (current) drug therapy: Secondary | ICD-10-CM | POA: Diagnosis not present

## 2018-01-17 DIAGNOSIS — Z6839 Body mass index (BMI) 39.0-39.9, adult: Secondary | ICD-10-CM | POA: Insufficient documentation

## 2018-01-17 DIAGNOSIS — K449 Diaphragmatic hernia without obstruction or gangrene: Secondary | ICD-10-CM | POA: Diagnosis not present

## 2018-01-17 DIAGNOSIS — E781 Pure hyperglyceridemia: Secondary | ICD-10-CM | POA: Diagnosis not present

## 2018-01-17 DIAGNOSIS — I10 Essential (primary) hypertension: Secondary | ICD-10-CM | POA: Insufficient documentation

## 2018-01-17 DIAGNOSIS — Z882 Allergy status to sulfonamides status: Secondary | ICD-10-CM | POA: Diagnosis not present

## 2018-02-05 ENCOUNTER — Encounter: Payer: Managed Care, Other (non HMO) | Attending: General Surgery | Admitting: Registered"

## 2018-02-05 DIAGNOSIS — Z87891 Personal history of nicotine dependence: Secondary | ICD-10-CM | POA: Insufficient documentation

## 2018-02-05 DIAGNOSIS — F329 Major depressive disorder, single episode, unspecified: Secondary | ICD-10-CM | POA: Insufficient documentation

## 2018-02-05 DIAGNOSIS — Z713 Dietary counseling and surveillance: Secondary | ICD-10-CM | POA: Insufficient documentation

## 2018-02-05 DIAGNOSIS — I10 Essential (primary) hypertension: Secondary | ICD-10-CM | POA: Insufficient documentation

## 2018-02-05 DIAGNOSIS — Z8249 Family history of ischemic heart disease and other diseases of the circulatory system: Secondary | ICD-10-CM | POA: Insufficient documentation

## 2018-02-05 DIAGNOSIS — Z6839 Body mass index (BMI) 39.0-39.9, adult: Secondary | ICD-10-CM | POA: Insufficient documentation

## 2018-02-05 DIAGNOSIS — Z79899 Other long term (current) drug therapy: Secondary | ICD-10-CM | POA: Insufficient documentation

## 2018-02-05 DIAGNOSIS — E782 Mixed hyperlipidemia: Secondary | ICD-10-CM | POA: Insufficient documentation

## 2018-02-05 DIAGNOSIS — Z882 Allergy status to sulfonamides status: Secondary | ICD-10-CM | POA: Insufficient documentation

## 2018-02-05 DIAGNOSIS — F419 Anxiety disorder, unspecified: Secondary | ICD-10-CM | POA: Insufficient documentation

## 2018-02-05 DIAGNOSIS — Z8261 Family history of arthritis: Secondary | ICD-10-CM | POA: Insufficient documentation

## 2018-02-05 DIAGNOSIS — E669 Obesity, unspecified: Secondary | ICD-10-CM

## 2018-02-05 DIAGNOSIS — G4733 Obstructive sleep apnea (adult) (pediatric): Secondary | ICD-10-CM | POA: Insufficient documentation

## 2018-02-09 NOTE — Progress Notes (Signed)
  Pre-Operative Nutrition Class:  Appt start time: 8:15 End time:  9:15.  Patient was seen on 02/05/2018 for Pre-Operative Bariatric Surgery Education at [facility].   Surgery date: TBD Surgery type: Sleeve Start weight at [facility]: 244.6 Weight today: 240.5   Samples given per MNT protocol. Patient educated on appropriate usage: Bariatric Advantage Multivitamin Lot # [id number] Exp: 03/2019  Bariatric Advantage Calcium Citrate Lot # [id number] Exp: 11/16/2018  Bariatric Advantage Calcium Citrate Lot # [id number] Exp: 10/26/2018  Premier Protein Powder Lot # [id number] Exp: 10/03/2018   The following the learning objectives were met by the patient during this course:  Identify Pre-Op Dietary Goals and will begin 2 weeks pre-operatively  Identify appropriate sources of fluids and proteins   State protein recommendations and appropriate sources pre and post-operatively  Identify Post-Operative Dietary Goals and will follow for 2 weeks post-operatively  Identify appropriate multivitamin and calcium sources  Describe the need for physical activity post-operatively and will follow MD recommendations  State when to call healthcare provider regarding medication questions or post-operative complications  Handouts given during class include:  Pre-Op Bariatric Surgery Diet Handout  Protein Shake Handout  Post-Op Bariatric Surgery Nutrition Handout  [facility] Program Information Flyer  Support Group Information Flyer  [facility] Bariatric Supplements Price List  Follow-Up Plan: Patient will follow-up at [facility] 2 weeks post operatively for diet advancement per MD.

## 2018-06-15 ENCOUNTER — Other Ambulatory Visit: Payer: Self-pay | Admitting: Family Medicine

## 2018-06-15 DIAGNOSIS — R109 Unspecified abdominal pain: Secondary | ICD-10-CM

## 2018-06-29 ENCOUNTER — Ambulatory Visit
Admission: RE | Admit: 2018-06-29 | Discharge: 2018-06-29 | Disposition: A | Discharge status: Home / Self Care | Payer: Managed Care, Other (non HMO) | Source: Ambulatory Visit | Attending: Family Medicine | Admitting: Family Medicine

## 2018-06-29 DIAGNOSIS — R109 Unspecified abdominal pain: Secondary | ICD-10-CM

## 2018-07-05 ENCOUNTER — Other Ambulatory Visit: Payer: Self-pay | Admitting: Family Medicine

## 2018-07-05 ENCOUNTER — Other Ambulatory Visit (HOSPITAL_COMMUNITY): Payer: Self-pay | Admitting: Family Medicine

## 2018-07-05 DIAGNOSIS — R109 Unspecified abdominal pain: Secondary | ICD-10-CM

## 2018-07-18 ENCOUNTER — Ambulatory Visit (HOSPITAL_COMMUNITY)
Admission: RE | Admit: 2018-07-18 | Discharge: 2018-07-18 | Disposition: A | Discharge status: Home / Self Care | Payer: Managed Care, Other (non HMO) | Source: Ambulatory Visit | Attending: Family Medicine | Admitting: Family Medicine

## 2018-07-18 ENCOUNTER — Encounter (HOSPITAL_COMMUNITY): Payer: Self-pay | Admitting: Radiology

## 2018-07-18 DIAGNOSIS — R109 Unspecified abdominal pain: Secondary | ICD-10-CM

## 2018-07-18 MED ORDER — TECHNETIUM TC 99M MEBROFENIN IV KIT
5.2200 | PACK | Freq: Once | INTRAVENOUS | Status: AC | PRN
  Administered 2018-07-18: 5.22 via INTRAVENOUS

## 2019-12-07 IMAGING — NM NM HEPATO W/GB/PHARM/[PERSON_NAME]
2 series · 12 of 12 positions shown · non-contrast
Comparison: None.

CLINICAL DATA: Abdominal pain and nausea with intolerance to fatty
foods

EXAM:
NUCLEAR MEDICINE HEPATOBILIARY IMAGING WITH GALLBLADDER EF
VIEWS:
Anterior right upper quadrant
RADIOPHARMACEUTICALS:  5.22 mCi Zc-PPm  Choletec IV

[Series 1: raw data · 4.46mm/px · 6 of 60 frames shown (1 of 2)]
[frame 6/60]
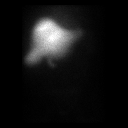
[frame 16/60]
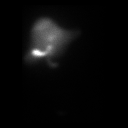
[frame 26/60]
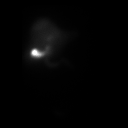
[frame 36/60]
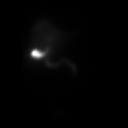
[frame 46/60]
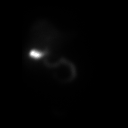
[frame 56/60]
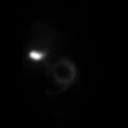

[Series 1: raw data · 4.46mm/px · 6 of 60 frames shown (2 of 2)]
[frame 6/60]
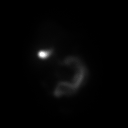
[frame 16/60]
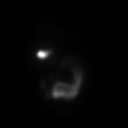
[frame 26/60]
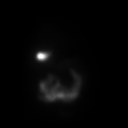
[frame 36/60]
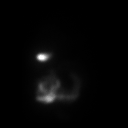
[frame 46/60]
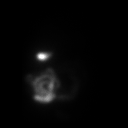
[frame 56/60]
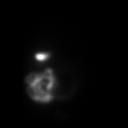

[12 of 12 positions shown; findings below may reference images not displayed]

FINDINGS: Liver uptake of radiotracer is unremarkable. There is prompt
visualization of gallbladder and small bowel, indicating patency of
the cystic and common bile ducts. The patient consumed 8 ounces of
Ensure orally with calculation of the computer generated ejection
fraction of radiotracer from the gallbladder. The patient did
experience pain with the oral Ensure consumption. The computer
generated ejection fraction of radiotracer from the gallbladder is
toward the lower limits of normal at 35%, normal greater than 33%
using the oral agent.
IMPRESSION: The ejection fraction of radiotracer from the gallbladder is toward
the lower limits of normal at 35%, normal greater than 33%. Patient
did experience pain with the oral Ensure consumption. Cystic and
common bile ducts are patent as is evidenced by visualization of
gallbladder and small bowel.

## 2023-06-09 ENCOUNTER — Encounter: Payer: Self-pay | Admitting: Family Medicine
